# Patient Record
Sex: Male | Born: 1945 | Race: White | Hispanic: No | Marital: Married | Smoking: Never smoker
Health system: Southern US, Community
[De-identification: ages and names within clinical notes are randomized; demographics above are authoritative.]

## PROBLEM LIST (undated history)

## (undated) DIAGNOSIS — F329 Major depressive disorder, single episode, unspecified: Secondary | ICD-10-CM

## (undated) DIAGNOSIS — Z87442 Personal history of urinary calculi: Secondary | ICD-10-CM

## (undated) DIAGNOSIS — I10 Essential (primary) hypertension: Secondary | ICD-10-CM

## (undated) DIAGNOSIS — M1990 Unspecified osteoarthritis, unspecified site: Secondary | ICD-10-CM

## (undated) DIAGNOSIS — N200 Calculus of kidney: Secondary | ICD-10-CM

## (undated) DIAGNOSIS — K219 Gastro-esophageal reflux disease without esophagitis: Secondary | ICD-10-CM

## (undated) DIAGNOSIS — F32A Depression, unspecified: Secondary | ICD-10-CM

## (undated) DIAGNOSIS — J3089 Other allergic rhinitis: Secondary | ICD-10-CM

## (undated) HISTORY — PX: KNEE DEBRIDEMENT: SHX1894

## (undated) HISTORY — DX: Major depressive disorder, single episode, unspecified: F32.9

## (undated) HISTORY — PX: BACK SURGERY: SHX140

## (undated) HISTORY — DX: Gastro-esophageal reflux disease without esophagitis: K21.9

## (undated) HISTORY — DX: Depression, unspecified: F32.A

## (undated) HISTORY — PX: LUMBAR LAMINECTOMY: SHX95

## (undated) HISTORY — PX: SEPTOPLASTY: SUR1290

## (undated) HISTORY — PX: HAMMER TOE SURGERY: SHX385

## (undated) HISTORY — DX: Unspecified osteoarthritis, unspecified site: M19.90

## (undated) HISTORY — DX: Essential (primary) hypertension: I10

---

## 2003-12-12 ENCOUNTER — Ambulatory Visit: Payer: Self-pay | Admitting: Unknown Physician Specialty

## 2003-12-18 ENCOUNTER — Other Ambulatory Visit: Payer: Self-pay

## 2003-12-19 ENCOUNTER — Observation Stay: Payer: Self-pay | Admitting: Internal Medicine

## 2005-12-23 ENCOUNTER — Ambulatory Visit: Payer: Self-pay | Admitting: Internal Medicine

## 2006-03-22 ENCOUNTER — Ambulatory Visit: Payer: Self-pay | Admitting: Urology

## 2006-04-26 ENCOUNTER — Ambulatory Visit: Payer: Self-pay | Admitting: Urology

## 2006-05-21 ENCOUNTER — Ambulatory Visit: Payer: Self-pay | Admitting: Urology

## 2006-12-03 ENCOUNTER — Ambulatory Visit: Payer: Self-pay | Admitting: Urology

## 2006-12-14 ENCOUNTER — Other Ambulatory Visit: Payer: Self-pay

## 2006-12-14 ENCOUNTER — Ambulatory Visit: Payer: Self-pay | Admitting: Otolaryngology

## 2006-12-20 ENCOUNTER — Ambulatory Visit: Payer: Self-pay | Admitting: Otolaryngology

## 2007-10-07 ENCOUNTER — Ambulatory Visit: Payer: Self-pay | Admitting: Unknown Physician Specialty

## 2007-10-12 ENCOUNTER — Ambulatory Visit: Payer: Self-pay | Admitting: Unknown Physician Specialty

## 2007-12-05 ENCOUNTER — Ambulatory Visit: Payer: Self-pay | Admitting: Urology

## 2007-12-15 ENCOUNTER — Ambulatory Visit: Payer: Self-pay | Admitting: Unknown Physician Specialty

## 2008-12-12 ENCOUNTER — Ambulatory Visit: Payer: Self-pay | Admitting: Urology

## 2009-01-07 ENCOUNTER — Ambulatory Visit: Payer: Self-pay | Admitting: Internal Medicine

## 2009-01-12 ENCOUNTER — Emergency Department: Payer: Self-pay | Admitting: Emergency Medicine

## 2010-06-12 ENCOUNTER — Ambulatory Visit: Payer: Self-pay | Admitting: Unknown Physician Specialty

## 2010-06-30 ENCOUNTER — Ambulatory Visit: Payer: Self-pay | Admitting: Unknown Physician Specialty

## 2010-07-03 LAB — PATHOLOGY REPORT

## 2010-12-18 ENCOUNTER — Ambulatory Visit: Payer: Self-pay | Admitting: Podiatry

## 2011-01-13 HISTORY — PX: GREEN LIGHT LASER TURP (TRANSURETHRAL RESECTION OF PROSTATE: SHX6260

## 2013-05-18 ENCOUNTER — Ambulatory Visit: Payer: Self-pay | Admitting: Anesthesiology

## 2013-05-22 ENCOUNTER — Ambulatory Visit: Payer: Self-pay | Admitting: Urology

## 2014-05-05 NOTE — Op Note (Signed)
PATIENT NAME:  Raymond PoliceWILSON, Sherwin H MR#:  [id number] DATE OF BIRTH:  May 31, 1945  DATE OF PROCEDURE:  05/22/2013  PREOPERATIVE DIAGNOSIS: Benign prostatic hypertrophy with bladder outlet obstruction.   POSTOPERATIVE DIAGNOSIS: Benign prostatic hypertrophy with bladder outlet obstruction.   PROCEDURE: Photovaporization of the prostate with green light laser.   SURGEON: Anola GurneyMichael , M.D.   ANESTHETIST: Dr. Darleene CleaverVan Staveren and Dr. Evelene Croon.   ANESTHETIC METHOD: General per Dr. Darleene CleaverVan Staveren and local per Dr. Evelene Croon.   INDICATIONS: See the dictated history and physical. After informed consent, the patient requests the above procedure.   OPERATIVE SUMMARY: After adequate general anesthesia had been obtained, the patient was placed into dorsal lithotomy position and the perineum was prepped and draped in the usual fashion. The laser scope was coupled with the camera and then visually advanced into the bladder. The bladder was moderately trabeculated. The patient had trilobar BPH with visual obstruction. He had intravesical growth of a median lobe. Both ureteral orifices were identified and had clear efflux. No bladder mucosal lesions were identified. At this point, the laser was set at a power setting of 80 watts. Bladder neck tissue and median lobe was vaporized. Power was then increased to 120 watts and obstructing tissue from the bladder neck to the verumontanum was vaporized. Finally, the power was increased to 180 watts and remaining obstructive tissue vaporized. At this point, the laser scope was removed. Viscous Xylocaine 10 mL was instilled within the urethra and the bladder. A 20-French silicone Foley catheter was placed. The catheter was irrigated until clear. B and O suppository was placed. The procedure was then terminated and the patient was transferred to the recovery room in stable condition.  ____________________________ Suszanne ConnersMichael R. Evelene Croon, MD mrw:aw D: 05/22/2013 14:49:43 ET T: 05/22/2013  14:59:43 ET JOB#: [id number]  cc: Suszanne ConnersMichael R. Evelene Croon, MD, <Dictator> Orson ApeMICHAEL R  MD ELECTRONICALLY SIGNED 05/22/2013 16:49

## 2014-05-05 NOTE — H&P (Signed)
PATIENT NAME:  Raymond Edwards, Raymond Edwards MR#:  [id number] DATE OF BIRTH:  [birth year]  DATE OF ADMISSION:  05/22/2013  CHIEF COMPLAINT: Difficulty voiding.   HISTORY OF PRESENT ILLNESS: Mr. Raymond Edwards is a 69 year old white male with BPH and lower urinary tract symptoms. He is currently taking Jalyn. He has significant voiding symptoms including incomplete emptying, frequency, intermittency, urgency, weak stream, straining, and nocturia x 4. AUA symptom score was 30 with a quality of life score of 5. He is status post TUMT in 2012. He comes in now for photovaporization of the prostate with a GreenLight laser. Most recent PSA was 3.4 ng/mL on May 03, 2013.   PAST MEDICAL HISTORY:   ALLERGIES: No drug allergies.   CURRENT MEDICATIONS: Included Flonase, magnesium sulfate, nifedipine, omega-3 fish oil, omeprazole, sertraline, and Tylenol.   PAST SURGICAL HISTORY: Includes lumbar laminectomy and nasal septal repair.   SOCIAL HISTORY: The patient denied tobacco use. He consumes 1 to 4 alcoholic beverages per week.   FAMILY HISTORY: Parents have heart disease and hypertension.   PAST AND CURRENT MEDICAL CONDITIONS:  1.  Hypertension.  2.  GERD.  3.  Depression.  4.  Erectile dysfunction.  5.  Allergic rhinitis. 6.  History of kidney stones.   REVIEW OF SYSTEMS: The patient denied chest pain, shortness of breath, diabetes, stroke, or coronary artery disease.   PHYSICAL EXAMINATION: GENERAL: Well-nourished white male in no distress.  HEENT: Sclerae were clear. Pupils were equally round, reactive to light and accommodation. Extraocular movements were intact.  NECK: Supple. No palpable cervical adenopathy. No audible carotid bruits.  LUNGS: Clear to auscultation.  CARDIOVASCULAR: Regular rhythm and rate without audible murmurs.  ABDOMEN: Soft, nontender abdomen.  GENITOURINARY: Uncircumcised testes, smooth, nontender but atrophic.  RECTAL: 40 gram, smooth nontender prostate.  NEUROMUSCULAR: Alert and  oriented x 3.   IMPRESSION: Benign prostatic hypertrophy with bladder outlet obstruction.   PLAN: Photovaporization of the prostate with a GreenLight laser.   ____________________________ Suszanne ConnersMichael R. Evelene CroonWolff, MD mrw:jcm D: 05/16/2013 13:09:47 ET T: 05/16/2013 13:23:18 ET JOB#: [id number]  cc: Suszanne ConnersMichael R. Evelene CroonWolff, MD, <Dictator> Orson ApeMICHAEL R  MD ELECTRONICALLY SIGNED 05/17/2013 8:19

## 2016-05-05 ENCOUNTER — Ambulatory Visit (INDEPENDENT_AMBULATORY_CARE_PROVIDER_SITE_OTHER): Payer: Medicare Other | Admitting: Urology

## 2016-05-05 ENCOUNTER — Encounter: Payer: Self-pay | Admitting: Urology

## 2016-05-05 VITALS — BP 151/82 | HR 86 | Ht 70.0 in | Wt 229.7 lb

## 2016-05-05 DIAGNOSIS — R3129 Other microscopic hematuria: Secondary | ICD-10-CM | POA: Diagnosis not present

## 2016-05-05 LAB — URINALYSIS, COMPLETE
Bilirubin, UA: NEGATIVE
GLUCOSE, UA: NEGATIVE
KETONES UA: NEGATIVE
Nitrite, UA: NEGATIVE
PROTEIN UA: NEGATIVE
SPEC GRAV UA: 1.015 (ref 1.005–1.030)
UUROB: 0.2 mg/dL (ref 0.2–1.0)
pH, UA: 5 (ref 5.0–7.5)

## 2016-05-05 LAB — MICROSCOPIC EXAMINATION: BACTERIA UA: NONE SEEN

## 2016-05-05 NOTE — Progress Notes (Signed)
05/05/2016 3:20 PM   Raymond Edwards. [birth year] [id number]  Referring provider: Marisue Ivan, MD [phone] [facility] [address] [facility] [place], [place] [address]  Chief Complaint  Patient presents with  . Hematuria  . Testicle Pain    HPI: I was consulted to assess the patient's recent diagnosis of microscopic hematuria. He has never smoked. He does take daily aspirin but no blood thinners. His flow was good. He gets up once at night. He has passed many kidney stones but is not a previous GU surgery  He has had back surgery but no bladder infections  The patient had a green light laser in 2015 by Dr. Evelene Croon. The patient has chronic renal insufficiency with a GFR of 50 mils per minute.  Modifying factors: There are no other modifying factors  Associated signs and symptoms: There are no other associated signs and symptoms Aggravating and relieving factors: There are no other aggravating or relieving factors Severity: Moderate Duration: Persistent   PMH: Past Medical History:  Diagnosis Date  . Arthritis   . Depression   . GERD (gastroesophageal reflux disease)   . Hypertension     Surgical History: Past Surgical History:  Procedure Laterality Date  . GREEN LIGHT LASER TURP (TRANSURETHRAL RESECTION OF PROSTATE  2013  . HAMMER TOE SURGERY    . KNEE DEBRIDEMENT Right   . LUMBAR LAMINECTOMY    . SEPTOPLASTY      Home Medications:  Allergies as of 05/05/2016   No Known Allergies     Medication List       Accurate as of 05/05/16  3:20 PM. Always use your most recent med list.          aspirin EC 81 MG tablet Take by mouth.   Cinnamon 500 MG capsule Take by mouth.   doxepin 25 MG capsule Commonly known as:  SINEQUAN   fluticasone 50 MCG/ACT nasal spray Commonly known as:  FLONASE PLACE 2 SPRAYS INTO BOTH NOSTRILS ONCE DAILY AS NEEDED.   losartan 50 MG tablet Commonly known as:  COZAAR Take by mouth.   Omega-3 1000 MG Caps Take by  mouth.   omeprazole 20 MG capsule Commonly known as:  PRILOSEC TAKE ONE CAPSULE BY MOUTH EVERY MORNING   sertraline 50 MG tablet Commonly known as:  ZOLOFT TAKE 1 TABLET BY MOUTH EVERY DAY       Allergies: No Known Allergies  Family History: Family History  Problem Relation Age of Onset  . Heart disease Father   . Heart disease Mother   . Prostate cancer Neg Hx   . Bladder Cancer Neg Hx   . Kidney cancer Neg Hx     Social History:  reports that he has never smoked. He has quit using smokeless tobacco. His smokeless tobacco use included Chew. He reports that he drinks alcohol. He reports that he does not use drugs.  ROS: UROLOGY Frequent Urination?: No Hard to postpone urination?: No Burning/pain with urination?: No Get up at night to urinate?: Yes Leakage of urine?: No Urine stream starts and stops?: No Trouble starting stream?: No Do you have to strain to urinate?: No Blood in urine?: Yes Urinary tract infection?: No Sexually transmitted disease?: No Injury to kidneys or bladder?: No Painful intercourse?: No Weak stream?: No Erection problems?: Yes Penile pain?: No  Gastrointestinal Nausea?: No Vomiting?: No Indigestion/heartburn?: No Diarrhea?: No Constipation?: No  Constitutional Fever: No Night sweats?: No Weight loss?: No Fatigue?: No  Skin Skin rash/lesions?: Yes  Itching?: Yes  Eyes Blurred vision?: No Double vision?: No  Ears/Nose/Throat Sore throat?: No Sinus problems?: No  Hematologic/Lymphatic Swollen glands?: No Easy bruising?: No  Cardiovascular Leg swelling?: No Chest pain?: No  Respiratory Cough?: No Shortness of breath?: No  Endocrine Excessive thirst?: No  Musculoskeletal Back pain?: Yes Joint pain?: No  Neurological Headaches?: No Dizziness?: No  Psychologic Depression?: Yes Anxiety?: No  Physical Exam: BP (!) 151/82 (BP Location: Left Arm, Patient Position: Sitting, Cuff Size: Normal)   Pulse 86    Ht  (1.778 m)   Wt 229 lb 11.2 oz (104.2 kg)   BMI 32.96 kg/m   Constitutional:  Alert and oriented, No acute distress. HEENT: [place] AT, moist mucus membranes.  Trachea midline, no masses. Cardiovascular: No clubbing, cyanosis, or edema. Respiratory: Normal respiratory effort, no increased work of breathing. GI: Abdomen is soft, nontender, nondistended, no abdominal masses GU: Small 40 g benign prostate Skin: No rashes, bruises or suspicious lesions. Lymph: No cervical or inguinal adenopathy. Neurologic: Grossly intact, no focal deficits, moving all 4 extremities. Psychiatric: Normal mood and affect.  Laboratory Data:  Urinalysis No results found for: COLORURINE, APPEARANCEUR, LABSPEC, PHURINE, GLUCOSEU, HGBUR, BILIRUBINUR, KETONESUR, PROTEINUR, UROBILINOGEN, NITRITE, LEUKOCYTESUR  Pertinent Imaging: None  Assessment & Plan:  The patient has microscopic hematuria. He has mild voiding dysfunction but overall does very well with mild nocturia. The workup was described. When he comes back he likely will be cystoscoped by another provider based upon scheduling. I will organize a CT scan.  1. Microscopic hematuria 2. Nighttime frequency - Urinalysis, Complete   No Follow-up on file.  Martina Sinner, MD  [facility] [address], [address] [place], [place] [address] [phone]

## 2016-05-05 NOTE — Addendum Note (Signed)
Addended by: Honor Loh on: 05/05/2016 04:51 PM   Modules accepted: Orders

## 2016-05-14 ENCOUNTER — Ambulatory Visit: Payer: Self-pay | Admitting: Urology

## 2016-05-19 ENCOUNTER — Ambulatory Visit
Admission: RE | Admit: 2016-05-19 | Discharge: 2016-05-19 | Disposition: A | Discharge status: Home / Self Care | Payer: Medicare Other | Source: Ambulatory Visit | Attending: Urology | Admitting: Urology

## 2016-05-19 DIAGNOSIS — K449 Diaphragmatic hernia without obstruction or gangrene: Secondary | ICD-10-CM | POA: Insufficient documentation

## 2016-05-19 DIAGNOSIS — R3129 Other microscopic hematuria: Secondary | ICD-10-CM

## 2016-05-19 DIAGNOSIS — N132 Hydronephrosis with renal and ureteral calculous obstruction: Secondary | ICD-10-CM | POA: Diagnosis not present

## 2016-05-19 DIAGNOSIS — K5730 Diverticulosis of large intestine without perforation or abscess without bleeding: Secondary | ICD-10-CM | POA: Insufficient documentation

## 2016-05-19 MED ORDER — IOPAMIDOL (ISOVUE-300) INJECTION 61%
150.0000 mL | Freq: Once | INTRAVENOUS | Status: AC | PRN
  Administered 2016-05-19: 125 mL via INTRAVENOUS

## 2016-05-20 ENCOUNTER — Encounter
Admission: RE | Admit: 2016-05-20 | Discharge: 2016-05-20 | Disposition: A | Discharge status: Home / Self Care | Payer: Medicare Other | Source: Ambulatory Visit | Attending: Urology | Admitting: Urology

## 2016-05-20 ENCOUNTER — Other Ambulatory Visit: Payer: Self-pay | Admitting: Radiology

## 2016-05-20 ENCOUNTER — Telehealth: Payer: Self-pay | Admitting: Radiology

## 2016-05-20 DIAGNOSIS — K219 Gastro-esophageal reflux disease without esophagitis: Secondary | ICD-10-CM | POA: Diagnosis not present

## 2016-05-20 DIAGNOSIS — Z87442 Personal history of urinary calculi: Secondary | ICD-10-CM | POA: Diagnosis not present

## 2016-05-20 DIAGNOSIS — Z7951 Long term (current) use of inhaled steroids: Secondary | ICD-10-CM | POA: Diagnosis not present

## 2016-05-20 DIAGNOSIS — N201 Calculus of ureter: Secondary | ICD-10-CM

## 2016-05-20 DIAGNOSIS — Z720 Tobacco use: Secondary | ICD-10-CM | POA: Diagnosis not present

## 2016-05-20 DIAGNOSIS — Z8249 Family history of ischemic heart disease and other diseases of the circulatory system: Secondary | ICD-10-CM | POA: Diagnosis not present

## 2016-05-20 DIAGNOSIS — Z79899 Other long term (current) drug therapy: Secondary | ICD-10-CM | POA: Diagnosis not present

## 2016-05-20 DIAGNOSIS — Z7982 Long term (current) use of aspirin: Secondary | ICD-10-CM | POA: Diagnosis not present

## 2016-05-20 DIAGNOSIS — Z9889 Other specified postprocedural states: Secondary | ICD-10-CM | POA: Diagnosis not present

## 2016-05-20 DIAGNOSIS — Z87891 Personal history of nicotine dependence: Secondary | ICD-10-CM | POA: Diagnosis not present

## 2016-05-20 DIAGNOSIS — F329 Major depressive disorder, single episode, unspecified: Secondary | ICD-10-CM | POA: Diagnosis not present

## 2016-05-20 DIAGNOSIS — R3129 Other microscopic hematuria: Secondary | ICD-10-CM | POA: Diagnosis not present

## 2016-05-20 DIAGNOSIS — I10 Essential (primary) hypertension: Secondary | ICD-10-CM

## 2016-05-20 HISTORY — DX: Calculus of kidney: N20.0

## 2016-05-20 HISTORY — DX: Other allergic rhinitis: J30.89

## 2016-05-20 LAB — BASIC METABOLIC PANEL
Anion gap: 6 (ref 5–15)
BUN: 15 mg/dL (ref 6–20)
CALCIUM: 8.9 mg/dL (ref 8.9–10.3)
CO2: 27 mmol/L (ref 22–32)
CREATININE: 1.51 mg/dL — AB (ref 0.61–1.24)
Chloride: 104 mmol/L (ref 101–111)
GFR calc non Af Amer: 45 mL/min — ABNORMAL LOW (ref >60)
GFR, EST AFRICAN AMERICAN: 52 mL/min — AB (ref >60)
Glucose, Bld: 111 mg/dL — ABNORMAL HIGH (ref 65–99)
Potassium: 3.8 mmol/L (ref 3.5–5.1)
SODIUM: 137 mmol/L (ref 135–145)

## 2016-05-20 NOTE — Patient Instructions (Signed)
Your procedure is scheduled on: 05/21/16 At 11:30 AM Report to Same Day Surgery 2nd floor medical mall [facility](Medical Mall Entrance-take elevator on left to 2nd floor.  Check in with surgery information desk.)   Remember: Instructions that are not followed completely may result in serious medical risk, up to and including death, or upon the discretion of your surgeon and anesthesiologist your surgery may need to be rescheduled.    _x___ 1. Do not eat food or drink liquids after midnight. No gum chewing or                              hard candies.     __x__ 2. No Alcohol for 24 hours before or after surgery.   __x__3. No Smoking for 24 prior to surgery.   ____  4. Bring all medications with you on the day of surgery if instructed.    __x__ 5. Notify your doctor if there is any change in your medical condition     (cold, fever, infections).     Do not wear jewelry, make-up, hairpins, clips or nail polish.  Do not wear lotions, powders, or perfumes. You may wear deodorant.  Do not shave 48 hours prior to surgery. Men may shave face and neck.  Do not bring valuables to the hospital.    [facility] is not responsible for any belongings or valuables.               Contacts, dentures or bridgework may not be worn into surgery.  Leave your suitcase in the car. After surgery it may be brought to your room.  For patients admitted to the hospital, discharge time is determined by your                       treatment team.   Patients discharged the day of surgery will not be allowed to drive home.  You will need someone to drive you home and stay with you the night of your procedure.    Please read over the following fact sheets that you were given:   [facility] Preparing for Surgery and or MRSA Information   _x___ Take anti-hypertensive (unless it includes a diuretic), cardiac, seizure, asthma,     anti-reflux and psychiatric medicines. These include:  1. sertraline (ZOLOFT  2.omeprazole  (PRILOSEC  3.  4.  5.  6.  ____Fleets enema or Magnesium Citrate as directed.   _x___ Use CHG Soap or sage wipes as directed on instruction sheet   ____ Use inhalers on the day of surgery and bring to hospital day of surgery  ____ Stop Metformin and Janumet 2 days prior to surgery.    ____ Take 1/2 of usual insulin dose the night before surgery and none on the morning     surgery.   _x___ Follow recommendations from Cardiologist, Pulmonologist or PCP regarding          stopping Aspirin, Coumadin, Pllavix ,Eliquis, Effient, or Pradaxa, and Pletal.  X____Stop Anti-inflammatories such as Advil, Aleve, Ibuprofen, Motrin, Naproxen, Naprosyn, Goodies powders or aspirin products. OK to take Tylenol and                          Celebrex.   _x___ Stop supplements until after surgery.  But may continue Vitamin D, Vitamin B,       and multivitamin.   ____ Layla MawBring  C-Pap to the hospital.

## 2016-05-20 NOTE — Telephone Encounter (Signed)
Notified pt of surgery with Dr Sherryl BartersBudzyn 05/21/16, pre-admit testing appt 05/20/16 @12 :15 & [facility] day of [facility]. Pt voices understanding.

## 2016-05-21 ENCOUNTER — Ambulatory Visit: Payer: Medicare Other | Admitting: Anesthesiology

## 2016-05-21 ENCOUNTER — Other Ambulatory Visit: Payer: Self-pay | Admitting: Urology

## 2016-05-21 ENCOUNTER — Ambulatory Visit
Admission: RE | Admit: 2016-05-21 | Discharge: 2016-05-21 | Disposition: A | Discharge status: Home / Self Care | Payer: Medicare Other | Source: Ambulatory Visit | Attending: Urology | Admitting: Urology

## 2016-05-21 ENCOUNTER — Other Ambulatory Visit: Payer: Self-pay | Admitting: Radiology

## 2016-05-21 ENCOUNTER — Telehealth: Payer: Self-pay | Admitting: Urology

## 2016-05-21 ENCOUNTER — Encounter
Admission: RE | Disposition: A | Discharge status: Home / Self Care | Payer: Self-pay | Source: Ambulatory Visit | Attending: Urology

## 2016-05-21 ENCOUNTER — Encounter: Payer: Self-pay | Admitting: *Deleted

## 2016-05-21 DIAGNOSIS — Z87891 Personal history of nicotine dependence: Secondary | ICD-10-CM | POA: Insufficient documentation

## 2016-05-21 DIAGNOSIS — Z720 Tobacco use: Secondary | ICD-10-CM | POA: Insufficient documentation

## 2016-05-21 DIAGNOSIS — Z7951 Long term (current) use of inhaled steroids: Secondary | ICD-10-CM | POA: Insufficient documentation

## 2016-05-21 DIAGNOSIS — Z87442 Personal history of urinary calculi: Secondary | ICD-10-CM | POA: Insufficient documentation

## 2016-05-21 DIAGNOSIS — Z9889 Other specified postprocedural states: Secondary | ICD-10-CM | POA: Insufficient documentation

## 2016-05-21 DIAGNOSIS — N201 Calculus of ureter: Secondary | ICD-10-CM | POA: Insufficient documentation

## 2016-05-21 DIAGNOSIS — Z7982 Long term (current) use of aspirin: Secondary | ICD-10-CM | POA: Insufficient documentation

## 2016-05-21 DIAGNOSIS — F329 Major depressive disorder, single episode, unspecified: Secondary | ICD-10-CM | POA: Insufficient documentation

## 2016-05-21 DIAGNOSIS — R3121 Asymptomatic microscopic hematuria: Secondary | ICD-10-CM | POA: Diagnosis not present

## 2016-05-21 DIAGNOSIS — R3129 Other microscopic hematuria: Secondary | ICD-10-CM | POA: Insufficient documentation

## 2016-05-21 DIAGNOSIS — K219 Gastro-esophageal reflux disease without esophagitis: Secondary | ICD-10-CM | POA: Insufficient documentation

## 2016-05-21 DIAGNOSIS — Z8249 Family history of ischemic heart disease and other diseases of the circulatory system: Secondary | ICD-10-CM | POA: Insufficient documentation

## 2016-05-21 DIAGNOSIS — Z79899 Other long term (current) drug therapy: Secondary | ICD-10-CM | POA: Insufficient documentation

## 2016-05-21 DIAGNOSIS — I10 Essential (primary) hypertension: Secondary | ICD-10-CM | POA: Insufficient documentation

## 2016-05-21 HISTORY — PX: CYSTOSCOPY WITH STENT PLACEMENT: SHX5790

## 2016-05-21 SURGERY — CYSTOSCOPY, WITH STENT INSERTION
Anesthesia: General | Laterality: Bilateral

## 2016-05-21 MED ORDER — PHENYLEPHRINE HCL 10 MG/ML IJ SOLN
INTRAMUSCULAR | Status: DC | PRN
  Administered 2016-05-21 (×2): 100 ug via INTRAVENOUS
  Administered 2016-05-21: 50 ug via INTRAVENOUS
  Administered 2016-05-21: 100 ug via INTRAVENOUS
  Administered 2016-05-21: 50 ug via INTRAVENOUS
  Administered 2016-05-21 (×2): 100 ug via INTRAVENOUS

## 2016-05-21 MED ORDER — FENTANYL CITRATE (PF) 100 MCG/2ML IJ SOLN: 25.0000 ug | INTRAMUSCULAR | Status: DC | PRN

## 2016-05-21 MED ORDER — PROPOFOL 10 MG/ML IV BOLUS
INTRAVENOUS | Status: DC | PRN
  Administered 2016-05-21: 200 mg via INTRAVENOUS
  Administered 2016-05-21: 100 mg via INTRAVENOUS

## 2016-05-21 MED ORDER — CEPHALEXIN 500 MG PO CAPS: 500.0000 mg | ORAL_CAPSULE | Freq: Three times a day (TID) | ORAL | 0 refills | Status: DC

## 2016-05-21 MED ORDER — OXYCODONE-ACETAMINOPHEN 5-325 MG PO TABS: 1.0000 | ORAL_TABLET | ORAL | 0 refills | Status: DC | PRN

## 2016-05-21 MED ORDER — ACETAMINOPHEN 10 MG/ML IV SOLN
INTRAVENOUS | Status: DC | PRN
  Administered 2016-05-21: 1000 mg via INTRAVENOUS

## 2016-05-21 MED ORDER — MIDAZOLAM HCL 2 MG/2ML IJ SOLN
INTRAMUSCULAR | Status: AC
  Filled 2016-05-21: qty 2

## 2016-05-21 MED ORDER — SODIUM CHLORIDE 0.9 % IR SOLN
Status: DC | PRN
  Administered 2016-05-21: 900 mL via INTRAVESICAL

## 2016-05-21 MED ORDER — FENTANYL CITRATE (PF) 100 MCG/2ML IJ SOLN
INTRAMUSCULAR | Status: DC | PRN
Start: 2016-05-21 — End: 2016-05-21
  Administered 2016-05-21: 50 ug via INTRAVENOUS
  Administered 2016-05-21 (×2): 25 ug via INTRAVENOUS

## 2016-05-21 MED ORDER — ONDANSETRON HCL 4 MG/2ML IJ SOLN
INTRAMUSCULAR | Status: AC
  Filled 2016-05-21: qty 2

## 2016-05-21 MED ORDER — LACTATED RINGERS IV SOLN
INTRAVENOUS | Status: DC
  Administered 2016-05-21: 12:00:00 via INTRAVENOUS

## 2016-05-21 MED ORDER — PROPOFOL 10 MG/ML IV BOLUS
INTRAVENOUS | Status: AC
  Filled 2016-05-21: qty 20

## 2016-05-21 MED ORDER — DEXAMETHASONE SODIUM PHOSPHATE 10 MG/ML IJ SOLN
INTRAMUSCULAR | Status: DC | PRN
  Administered 2016-05-21: 10 mg via INTRAVENOUS

## 2016-05-21 MED ORDER — EPHEDRINE SULFATE 50 MG/ML IJ SOLN
INTRAMUSCULAR | Status: DC | PRN
  Administered 2016-05-21: 10 mg via INTRAVENOUS
  Administered 2016-05-21: 5 mg via INTRAVENOUS

## 2016-05-21 MED ORDER — ACETAMINOPHEN 10 MG/ML IV SOLN
INTRAVENOUS | Status: AC
  Filled 2016-05-21: qty 100

## 2016-05-21 MED ORDER — ONDANSETRON HCL 4 MG/2ML IJ SOLN
INTRAMUSCULAR | Status: DC | PRN
  Administered 2016-05-21: 4 mg via INTRAVENOUS

## 2016-05-21 MED ORDER — MIDAZOLAM HCL 2 MG/2ML IJ SOLN
INTRAMUSCULAR | Status: DC | PRN
  Administered 2016-05-21: 2 mg via INTRAVENOUS

## 2016-05-21 MED ORDER — DEXAMETHASONE SODIUM PHOSPHATE 10 MG/ML IJ SOLN
INTRAMUSCULAR | Status: AC
  Filled 2016-05-21: qty 1

## 2016-05-21 MED ORDER — FENTANYL CITRATE (PF) 100 MCG/2ML IJ SOLN
INTRAMUSCULAR | Status: AC
  Filled 2016-05-21: qty 2

## 2016-05-21 MED ORDER — ONDANSETRON HCL 4 MG/2ML IJ SOLN: 4.0000 mg | Freq: Once | INTRAMUSCULAR | Status: DC | PRN

## 2016-05-21 MED ORDER — CEFAZOLIN SODIUM-DEXTROSE 2-4 GM/100ML-% IV SOLN
2.0000 g | INTRAVENOUS | Status: AC
  Administered 2016-05-21: 2 g via INTRAVENOUS

## 2016-05-21 MED ORDER — CEFAZOLIN SODIUM-DEXTROSE 2-4 GM/100ML-% IV SOLN
INTRAVENOUS | Status: AC
  Administered 2016-05-21: 2 g via INTRAVENOUS
  Filled 2016-05-21: qty 100

## 2016-05-21 MED ORDER — SEVOFLURANE IN SOLN
RESPIRATORY_TRACT | Status: AC
Start: 2016-05-21 — End: 2016-05-21
  Filled 2016-05-21: qty 250

## 2016-05-21 MED ORDER — LIDOCAINE HCL (CARDIAC) 20 MG/ML IV SOLN
INTRAVENOUS | Status: DC | PRN
  Administered 2016-05-21: 80 mg via INTRAVENOUS

## 2016-05-21 SURGICAL SUPPLY — 22 items
BACTOSHIELD CHG 4% 4OZ (MISCELLANEOUS) ×2
CATH URETL 5X70 OPEN END (CATHETERS) ×3 IMPLANT
CONRAY 43 FOR UROLOGY 50M (MISCELLANEOUS) ×3 IMPLANT
GLIDEWIRE STIFF .35X180X3 HYDR (WIRE) ×3 IMPLANT
GLOVE BIO SURGEON STRL SZ7.5 (GLOVE) ×3 IMPLANT
GOWN STRL REUS W/ TWL LRG LVL4 (GOWN DISPOSABLE) ×3 IMPLANT
GOWN STRL REUS W/TWL LRG LVL4 (GOWN DISPOSABLE) ×6
GOWN STRL REUS W/TWL XL LVL3 (GOWN DISPOSABLE) IMPLANT
INTRODUCER DILATOR DOUBLE (INTRODUCER) ×3 IMPLANT
KIT RM TURNOVER CYSTO AR (KITS) ×3 IMPLANT
PACK CYSTO AR (MISCELLANEOUS) ×3 IMPLANT
SCRUB CHG 4% DYNA-HEX 4OZ (MISCELLANEOUS) ×1 IMPLANT
SENSORWIRE 0.038 NOT ANGLED (WIRE)
SET CYSTO W/LG BORE CLAMP LF (SET/KITS/TRAYS/PACK) ×3 IMPLANT
SOL .9 NS 3000ML IRR  AL (IV SOLUTION) ×2
SOL .9 NS 3000ML IRR UROMATIC (IV SOLUTION) ×1 IMPLANT
STENT URET 6FRX24 CONTOUR (STENTS) IMPLANT
STENT URET 6FRX26 CONTOUR (STENTS) ×3 IMPLANT
SURGILUBE 2OZ TUBE FLIPTOP (MISCELLANEOUS) ×3 IMPLANT
SYR 30ML LL (SYRINGE) ×3 IMPLANT
WATER STERILE IRR 1000ML POUR (IV SOLUTION) ×3 IMPLANT
WIRE SENSOR 0.038 NOT ANGLED (WIRE) IMPLANT

## 2016-05-21 NOTE — Anesthesia Procedure Notes (Signed)
Procedure Name: LMA Insertion Date/Time: 05/21/2016 12:43 PM Performed by: Karoline CaldwellSTARR,  Pre-anesthesia Checklist: Patient identified, Patient being monitored, Timeout performed, Emergency Drugs available and Suction available Patient Re-evaluated:Patient Re-evaluated prior to inductionOxygen Delivery Method: Circle system utilized Preoxygenation: Pre-oxygenation with 100% oxygen Intubation Type: IV induction Ventilation: Mask ventilation without difficulty LMA: LMA inserted LMA Size: 4.5 Tube type: Oral Number of attempts: 1 Placement Confirmation: positive ETCO2 and breath sounds checked- equal and bilateral Tube secured with: Tape Dental Injury: Teeth and Oropharynx as per pre-operative assessment

## 2016-05-21 NOTE — Discharge Instructions (Signed)
AMBULATORY SURGERY  DISCHARGE INSTRUCTIONS   1) The drugs that you were given will stay in your system until tomorrow so for the next 24 hours you should not:  A) Drive an automobile B) Make any legal decisions C) Drink any alcoholic beverage   2) You may resume regular meals tomorrow.  Today it is better to start with liquids and gradually work up to solid foods.  You may eat anything you prefer, but it is better to start with liquids, then soup and crackers, and gradually work up to solid foods.   3) Please notify your doctor immediately if you have any unusual bleeding, trouble breathing, redness and pain at the surgery site, drainage, fever, or pain not relieved by medication.    4) Additional Instructions: TAKE A STOOL SOFTENER TWICE A DAY WHILE TAKING NARCOTIC PAIN MEDICINE TO PREVENT CONSTIPATION   Please contact your physician with any problems or Same Day Surgery at [phone], Monday through Friday 6 am to 4 pm, or [place] at [facility] Main number at [phone].   Cystoscopy, Care After Refer to this sheet in the next few weeks. These instructions provide you with information about caring for yourself after your procedure. Your health care provider may also give you more specific instructions. Your treatment has been planned according to current medical practices, but problems sometimes occur. Call your health care provider if you have any problems or questions after your procedure. What can I expect after the procedure? After the procedure, it is common to have:  Mild pain when you urinate. Pain should stop within a few minutes after you urinate. This may last for up to 1 week.  A small amount of blood in your urine for several days.  Feeling like you need to urinate but producing only a small amount of urine. Follow these instructions at home:   Medicines   Take over-the-counter and prescription medicines only as told by your health care provider.  If you  were prescribed an antibiotic medicine, take it as told by your health care provider. Do not stop taking the antibiotic even if you start to feel better. General instructions    Return to your normal activities as told by your health care provider. Ask your health care provider what activities are safe for you.  Do not drive for 24 hours if you received a sedative.  Watch for any blood in your urine. If the amount of blood in your urine increases, call your health care provider.  Follow instructions from your health care provider about eating or drinking restrictions.  If a tissue sample was removed for testing (biopsy) during your procedure, it is your responsibility to get your test results. Ask your health care provider or the department performing the test when your results will be ready.  Drink enough fluid to keep your urine clear or pale yellow.  Keep all follow-up visits as told by your health care provider. This is important. Contact a health care provider if:  You have pain that gets worse or does not get better with medicine, especially pain when you urinate.  You have difficulty urinating. Get help right away if:  You have more blood in your urine.  You have blood clots in your urine.  You have abdominal pain.  You have a fever or chills.  You are unable to urinate. This information is not intended to replace advice given to you by your health care provider. Make sure you discuss any questions you  have with your health care provider. Document Released: 07/18/2004 Document Revised: 06/06/2015 Document Reviewed: 11/15/2014 Elsevier Interactive Patient Education  2017 [employer].

## 2016-05-21 NOTE — Anesthesia Post-op Follow-up Note (Cosign Needed)
Anesthesia QCDR form completed.        

## 2016-05-21 NOTE — Telephone Encounter (Signed)
CT reviewed yesterday, large bilateral obstructing ureteral stones. Options reviewed in detail, given concern for bilateral obstruction, I recommended urgent ureteral stent placement. At the time of our phone call yesterday, he started eating breakfast therefore we'll proceed tomorrow. All questions were answered. He'll ultimately need bilateral ureteroscopy versus ESWL for definitive management of his stone. Currently, he is completely asymptomatic. BMP checked, Cr ~1.5 which is stable but increased from last year.    Vanna ScotlandAshley , MD

## 2016-05-21 NOTE — Op Note (Signed)
Date of procedure: 05/21/16  Preoperative diagnosis:  1. Bilateral ureteral stones  2. Microscopic hematuria  Postoperative diagnosis:  1. Same   Procedure: 1. Cystoscopy 2. Bilateral retrograde pyelogram with interpretation 3. Attempted right ureteral stent placement 4. Left ureteral stent placement 4.8 French by 26 cm  Surgeon: Baruch Gouty, MD  Anesthesia: General  Complications: None  Intraoperative findings: The patient's right proximal 1/2 cm UPJ stone was impacted. I was unable to pass a wire successfully pass the stone. A small amount of contrast was able to be pushed past the stone however most of the contrast and back into the bladder. The left ureteral stone was also impacted in the distal ureter. Small 0.8 [place] by 26 cm double-J ureteral stent was able to place. Left retropyelogram did show some contrast in the upper elected system to identify the renal pelvis for correct stent placed.  EBL: None  Specimens: None  Drains: 4.8 [place] by 26 under left double-J ureteral stent  Disposition: Stable to the postanesthesia care unit  Indication for procedure: The patient is a 71 y.o. male with history of microscopic hematuria who had a workup revealed bilateral ureteral stones. He had a 1.5 cm right UPJ stone and a left 1.5 cm mid to distal ureteral stone. There is also some smaller stones. He presents today for urgent stent placement due to bilateral ureteral obstruction..  After reviewing the management options for treatment, the patient elected to proceed with the above surgical procedure(s). We have discussed the potential benefits and risks of the procedure, side effects of the proposed treatment, the likelihood of the patient achieving the goals of the procedure, and any potential problems that might occur during the procedure or recuperation. Informed consent has been obtained.  Description of procedure: The patient was met in the preoperative area. All risks,  benefits, and indications of the procedure were described in great detail. The patient consented to the procedure. Preoperative antibiotics were given. The patient was taken to the operative theater. General anesthesia was induced per the anesthesia service. The patient was then placed in the dorsal lithotomy position and prepped and draped in the usual sterile fashion. A preoperative timeout was called.   A 21 French 30 cystoscope was advanced in the patient's bladder per urethra atraumatically. The right ureteral orifice was identified and retrograde polygrams obtained. Contrast was very difficult pushed past the visualized right UPJ stone. Only a small amount of contrast was able to be advanced past the stone into the right renal pelvis to identify. For approximately 20 minutes attempts were made to place multiple different types of wires past the stone. However the stone was impacted. At this point was aborted because his felt to be futile. This process was repeated on the left side. The stone was seen in the mid to distal left ureter. With some degree of difficulty sensor wire was eventually able to be passed into the proximal left renal pelvis under fluoroscopy. This was done after a retrograde polygrams obtained. A 4.8 French by 26 over double-J ureteral stent was then placed and the sensor wire removed. A curl seen in the patient's left renal pelvis on fluoroscopy and the urinary bladder under direct position. There is excellent drainage clear yellow urine through the stent this point. Due the patient's right ureteral stone being impacted and unable to place a retrograde stent and the patient now having an obstructed left collecting system, decision was made to end the procedure. The patient's bladder was drained. He was  woke from anesthesia and transferred stable condition to postanesthesia care unit.   Plan: The patient will need nonemergent antegrade right ureteral stent placement by interventional  radiology. After this is performed, we can plan for bilateral ureteroscopy with laser lithotripsy to address his bilateral stone burden approximately 2 weeks after placement allow for passive dilation of the bilateral ureters.  Baruch Gouty, M.D.

## 2016-05-21 NOTE — Telephone Encounter (Signed)
-----   Message from Alfredo MartinezScott MacDiarmid, MD sent at 05/20/2016  7:44 AM EDT -----   ----- Message ----- From: Skeet LatchWatkins, Chelsea C, LPN Sent: 1/1/91475/08/2016  12:34 PM To: Alfredo MartinezScott MacDiarmid, MD    ----- Message ----- From: Interface, Rad Results In Sent: 05/19/2016  12:21 PM To: Jennette KettleBua Clinical

## 2016-05-21 NOTE — Anesthesia Preprocedure Evaluation (Signed)
Anesthesia Evaluation  Patient identified by MRN, date of birth, ID band Patient awake    Reviewed: Allergy & Precautions, H&P , NPO status , Patient's Chart, lab work & pertinent test results, reviewed documented beta blocker date and time   History of Anesthesia Complications Negative for: history of anesthetic complications  Airway Mallampati: III  TM Distance: >3 FB Neck ROM: full    Dental  (+) Caps, Missing, Chipped, Poor Dentition   Pulmonary neg pulmonary ROS,           Cardiovascular Exercise Tolerance: Good hypertension, (-) angina(-) CAD, (-) Past MI, (-) Cardiac Stents and (-) CABG (-) dysrhythmias (-) Valvular Problems/Murmurs     Neuro/Psych PSYCHIATRIC DISORDERS (Depression) negative neurological ROS     GI/Hepatic Neg liver ROS, GERD  ,  Endo/Other  negative endocrine ROS  Renal/GU Renal disease (stones)  negative genitourinary   Musculoskeletal   Abdominal   Peds  Hematology negative hematology ROS (+)   Anesthesia Other Findings Past Medical History: No date: Arthritis No date: Depression No date: Environmental and seasonal allergies No date: GERD (gastroesophageal reflux disease) No date: Hypertension No date: Kidney stones   Reproductive/Obstetrics negative OB ROS                             Anesthesia Physical Anesthesia Plan  ASA: III  Anesthesia Plan: General   Post-op Pain Management:    Induction:   Airway Management Planned:   Additional Equipment:   Intra-op Plan:   Post-operative Plan:   Informed Consent: I have reviewed the patients History and Physical, chart, labs and discussed the procedure including the risks, benefits and alternatives for the proposed anesthesia with the patient or authorized representative who has indicated his/her understanding and acceptance.   Dental Advisory Given  Plan Discussed with: Anesthesiologist, CRNA and  Surgeon  Anesthesia Plan Comments:         Anesthesia Quick Evaluation

## 2016-05-21 NOTE — Transfer of Care (Signed)
Immediate Anesthesia Transfer of Care Note  Patient: Eda KeysColeman H Caples Jr.  Procedure(s) Performed: Procedure(s): CYSTOSCOPY WITH bilateral retrorade pylogram, left ureteral stent placement, attempted inertion of right ureteral stent (Bilateral)  Patient Location: PACU  Anesthesia Type:General  Level of Consciousness: awake, alert  and oriented  Airway & Oxygen Therapy: Patient Spontanous Breathing and Patient connected to face mask oxygen  Post-op Assessment: Report given to RN and Post -op Vital signs reviewed and stable  Post vital signs: Reviewed and stable  Last Vitals:  Vitals:   05/21/16 1149 05/21/16 1346  BP: (!) 181/100 138/76  Pulse: 78 81  Resp: 18 10  Temp: 36.7 C 36.3 C    Last Pain:  Vitals:   05/21/16 1346  TempSrc:   PainSc: 0-No pain         Complications: No apparent anesthesia complications

## 2016-05-21 NOTE — H&P (Signed)
11:51 AM   Raymond Edwards H Barabas Jr. 28-Feb-1945 [id number]  CC: Bilateral ureteral stones  HPI: The patient is a 71 year old gentleman who presents today for urgent bilateral ureteral stent placement via cystoscopy after being diagnosed with bilateral ureteral stones on CT urogram performed for microscopic hematuria. He has a large 1.6 cm UPJ stone on the right.  His creatinine is 1.5 which is surprisingly good given how large the bilateral obstructing stones are. Though it is slightly raised from baseline. He currently is voiding. At this time, we'll proceed with bilateral ureteral stent placement and cystoscopy.   PMH: Past Medical History:  Diagnosis Date  . Arthritis   . Depression   . Environmental and seasonal allergies   . GERD (gastroesophageal reflux disease)   . Hypertension   . Kidney stones     Surgical History: Past Surgical History:  Procedure Laterality Date  . GREEN LIGHT LASER TURP (TRANSURETHRAL RESECTION OF PROSTATE  2013  . HAMMER TOE SURGERY    . KNEE DEBRIDEMENT Right   . LUMBAR LAMINECTOMY    . SEPTOPLASTY        Allergies: No Known Allergies  Family History: Family History  Problem Relation Age of Onset  . Heart disease Father   . Heart disease Mother   . Prostate cancer Neg Hx   . Bladder Cancer Neg Hx   . Kidney cancer Neg Hx     Social History:  reports that he has never smoked. He has quit using smokeless tobacco. His smokeless tobacco use included Chew. He reports that he drinks about 4.2 oz of alcohol per week . He reports that he does not use drugs.  ROS: 12 point ROS negative except for above                                        Physical Exam: BP (!) 181/100   Pulse 78   Temp 98 F (36.7 C) (Oral)   Resp 18   Ht 5\' 10"  (1.778 m)   Wt 230 lb (104.3 kg)   SpO2 99%   BMI 33.00 kg/m   Constitutional:  Alert and oriented, No acute distress. HEENT: [place] AT, moist mucus membranes.  Trachea midline, no  masses. Cardiovascular: No clubbing, cyanosis, or edema. RRR Respiratory: Normal respiratory effort, no increased work of breathing. Lungs clear GI: Abdomen is soft, nontender, nondistended, no abdominal masses GU: No CVA tenderness.  Skin: No rashes, bruises or suspicious lesions. Lymph: No cervical or inguinal adenopathy. Neurologic: Grossly intact, no focal deficits, moving all 4 extremities. Psychiatric: Normal mood and affect.  Laboratory Data: No results found for: WBC, HGB, HCT, MCV, PLT  Lab Results  Component Value Date   CREATININE 1.51 (H) 05/20/2016    No results found for: PSA  No results found for: TESTOSTERONE  No results found for: HGBA1C  Urinalysis    Component Value Date/Time   APPEARANCEUR Clear 05/05/2016 1500   GLUCOSEU Negative 05/05/2016 1500   BILIRUBINUR Negative 05/05/2016 1500   PROTEINUR Negative 05/05/2016 1500   NITRITE Negative 05/05/2016 1500   LEUKOCYTESUR Trace (A) 05/05/2016 1500    Pertinent Imaging: CT reviewed as above  Assessment & Plan:    1. Bilateral ureteral stones 2. Microscopic hematuria I discussed the patient that he has bilateral ureteral stones which puts him at risk for renal failure. We'll plan to proceed today with urgent bilateral  ureteral stent placement via cystoscopy. We discussed the risks, benefits, and indications for this procedure. He understands the risks include bleeding, infection, inability to place ureteral stents. He understands that the goal be definitive stone management of his very large bilateral ureteral stones at a later date. Our goal today simply decompress his kidneys. He does understand the risks for a possible nephrostomy tube if the stone is unable to place. We'll proceed with surgery today. We'll also perform cystoscopy at the time of surgery. His microscopic hematuria is likely related to stones but this will complete his microscopic hematuria workup.   Hildred LaserBrian James , MD  [facility] [address], [address] [place], [place] [address] [phone]

## 2016-05-22 ENCOUNTER — Encounter: Payer: Self-pay | Admitting: Urology

## 2016-05-22 ENCOUNTER — Telehealth: Payer: Self-pay | Admitting: Radiology

## 2016-05-22 NOTE — Telephone Encounter (Signed)
Notified pt's wife of right antegrade ureteral stent placement in IR scheduled 05/29/16 with arrival to [facility] at 8:30. Advised pt to be npo after mn including medications (pt states losartan is taken in pm) & have a driver present for procedure. Wife voices understanding.

## 2016-05-22 NOTE — Anesthesia Postprocedure Evaluation (Signed)
Anesthesia Post Note  Patient: Eda KeysColeman H Mattos Jr.  Procedure(s) Performed: Procedure(s) (LRB): CYSTOSCOPY WITH bilateral retrorade pylogram, left ureteral stent placement, attempted inertion of right ureteral stent (Bilateral)  Patient location during evaluation: PACU Anesthesia Type: General Level of consciousness: awake and alert Pain management: pain level controlled Vital Signs Assessment: post-procedure vital signs reviewed and stable Respiratory status: spontaneous breathing, nonlabored ventilation, respiratory function stable and patient connected to nasal cannula oxygen Cardiovascular status: blood pressure returned to baseline and stable Postop Assessment: no signs of nausea or vomiting Anesthetic complications: no     Last Vitals:  Vitals:   05/21/16 1446 05/21/16 1501  BP: 139/79 (!) 155/77  Pulse: 66 68  Resp: 16   Temp: 36.2 C     Last Pain:  Vitals:   05/21/16 1446  TempSrc: Temporal  PainSc:                  Yevette EdwardsJames G 

## 2016-05-22 NOTE — Telephone Encounter (Signed)
-----   Message from Brian James Budzyn, MD sent at 05/21/2016  1:48 PM EDT ----- I was only able to get one stent in on the left. He will need non-emergent right antegrade ureteral stent placement by IR. He will then need cysto, blt urs, laser litho at least 2 weeks after stent is placed by IR. thanks 

## 2016-05-26 ENCOUNTER — Ambulatory Visit: Payer: Self-pay

## 2016-05-28 ENCOUNTER — Other Ambulatory Visit: Payer: Self-pay | Admitting: Physician Assistant

## 2016-05-29 ENCOUNTER — Ambulatory Visit
Admission: RE | Admit: 2016-05-29 | Discharge: 2016-05-29 | Disposition: A | Discharge status: Home / Self Care | Payer: Medicare Other | Source: Ambulatory Visit | Attending: Urology | Admitting: Urology

## 2016-05-29 DIAGNOSIS — N201 Calculus of ureter: Secondary | ICD-10-CM

## 2016-05-29 DIAGNOSIS — K219 Gastro-esophageal reflux disease without esophagitis: Secondary | ICD-10-CM | POA: Insufficient documentation

## 2016-05-29 DIAGNOSIS — N132 Hydronephrosis with renal and ureteral calculous obstruction: Secondary | ICD-10-CM | POA: Diagnosis present

## 2016-05-29 DIAGNOSIS — F329 Major depressive disorder, single episode, unspecified: Secondary | ICD-10-CM | POA: Insufficient documentation

## 2016-05-29 DIAGNOSIS — Z8249 Family history of ischemic heart disease and other diseases of the circulatory system: Secondary | ICD-10-CM | POA: Diagnosis not present

## 2016-05-29 DIAGNOSIS — Z7982 Long term (current) use of aspirin: Secondary | ICD-10-CM | POA: Diagnosis not present

## 2016-05-29 DIAGNOSIS — I10 Essential (primary) hypertension: Secondary | ICD-10-CM | POA: Insufficient documentation

## 2016-05-29 DIAGNOSIS — M1990 Unspecified osteoarthritis, unspecified site: Secondary | ICD-10-CM | POA: Insufficient documentation

## 2016-05-29 DIAGNOSIS — Z7951 Long term (current) use of inhaled steroids: Secondary | ICD-10-CM | POA: Insufficient documentation

## 2016-05-29 HISTORY — PX: IR URETERAL STENT RIGHT NEW ACCESS W/SEP NEPHROSTOMY CATH: IMG6078

## 2016-05-29 LAB — CBC
HEMATOCRIT: 40.7 % (ref 40.0–52.0)
HEMOGLOBIN: 13.8 g/dL (ref 13.0–18.0)
MCH: 30.7 pg (ref 26.0–34.0)
MCHC: 33.8 g/dL (ref 32.0–36.0)
MCV: 90.7 fL (ref 80.0–100.0)
Platelets: 332 10*3/uL (ref 150–440)
RBC: 4.49 MIL/uL (ref 4.40–5.90)
RDW: 13.3 % (ref 11.5–14.5)
WBC: 9.1 10*3/uL (ref 3.8–10.6)

## 2016-05-29 LAB — BASIC METABOLIC PANEL
Anion gap: 7 (ref 5–15)
BUN: 26 mg/dL — AB (ref 6–20)
CHLORIDE: 103 mmol/L (ref 101–111)
CO2: 24 mmol/L (ref 22–32)
CREATININE: 1.94 mg/dL — AB (ref 0.61–1.24)
Calcium: 8.3 mg/dL — ABNORMAL LOW (ref 8.9–10.3)
GFR calc Af Amer: 39 mL/min — ABNORMAL LOW (ref >60)
GFR calc non Af Amer: 33 mL/min — ABNORMAL LOW (ref >60)
Glucose, Bld: 104 mg/dL — ABNORMAL HIGH (ref 65–99)
Potassium: 4.1 mmol/L (ref 3.5–5.1)
Sodium: 134 mmol/L — ABNORMAL LOW (ref 135–145)

## 2016-05-29 LAB — PROTIME-INR
INR: 1.08
Prothrombin Time: 14 seconds (ref 11.4–15.2)

## 2016-05-29 LAB — APTT: aPTT: 28 seconds (ref 24–36)

## 2016-05-29 MED ORDER — CIPROFLOXACIN IN D5W 400 MG/200ML IV SOLN: 400.0000 mg | Freq: Once | INTRAVENOUS | Status: DC

## 2016-05-29 MED ORDER — HYDROCODONE-ACETAMINOPHEN 5-325 MG PO TABS: 1.0000 | ORAL_TABLET | ORAL | Status: DC | PRN

## 2016-05-29 MED ORDER — CEFAZOLIN SODIUM-DEXTROSE 2-4 GM/100ML-% IV SOLN: 2.0000 g | Freq: Once | INTRAVENOUS | Status: DC

## 2016-05-29 MED ORDER — IOPAMIDOL (ISOVUE-300) INJECTION 61%
30.0000 mL | Freq: Once | INTRAVENOUS | Status: AC | PRN
  Administered 2016-05-29: 10:00:00 10 mL via INTRAVENOUS

## 2016-05-29 MED ORDER — CEFAZOLIN SODIUM-DEXTROSE 2-4 GM/100ML-% IV SOLN
2.0000 g | Freq: Once | INTRAVENOUS | Status: AC
  Administered 2016-05-29: 2 g via INTRAVENOUS

## 2016-05-29 MED ORDER — MIDAZOLAM HCL 2 MG/2ML IJ SOLN
INTRAMUSCULAR | Status: AC | PRN
  Administered 2016-05-29 (×2): 1 mg via INTRAVENOUS

## 2016-05-29 MED ORDER — SODIUM CHLORIDE 0.9 % IV SOLN
INTRAVENOUS | Status: DC
  Administered 2016-05-29: 09:00:00 via INTRAVENOUS

## 2016-05-29 MED ORDER — FENTANYL CITRATE (PF) 100 MCG/2ML IJ SOLN
INTRAMUSCULAR | Status: AC | PRN
  Administered 2016-05-29 (×2): 50 ug via INTRAVENOUS

## 2016-05-29 NOTE — Procedures (Signed)
Interventional Radiology Procedure Note  Procedure: Right ureteral stent placement with new access of right collecting system  Complications: None  Estimated Blood Loss: < 10 mL  After perc access of right renal collecting system, antegrade guidewire access established around obstructing proximal ureteral calculus.  8.5 Fr x 28 cm JJ ureteral stent placed extending from renal pelvis to bladder.  Draining well through stent.  Perc access removed.  Jodi MarbleGlenn T. Fredia SorrowYamagata, M.D Pager:  [phone]

## 2016-05-29 NOTE — H&P (Signed)
Chief Complaint: Patient was seen in consultation today for right antegrade ureteral stent placement at the request of Hildred LaserBudzyn,Brian James  Referring Physician(s): Hildred LaserBudzyn,Brian James  Patient Status: [facility] - Out-pt  History of Present Illness: Raymond KeysColeman H Nordin Jr. is a 71 y.o. male presenting with bilateral obstructing ureteral calculi and hydronephrosis.  Status post left retrograde ureteral stent placement by Dr. Sherryl BartersBudzyn on 5/10.  Unable to pass wire around impacted right ureteral calculus during that procedure and referred for antegrade access and right ureteral stenting.  Plan is for future bialteral laser lithotripsy from retrograde ureteroscopic approach.  Past Medical History:  Diagnosis Date  . Arthritis   . Depression   . Environmental and seasonal allergies   . GERD (gastroesophageal reflux disease)   . Hypertension   . Kidney stones     Past Surgical History:  Procedure Laterality Date  . CYSTOSCOPY WITH STENT PLACEMENT Bilateral 05/21/2016   Procedure: CYSTOSCOPY WITH bilateral retrorade pylogram, left ureteral stent placement, attempted inertion of right ureteral stent;  Surgeon: Hildred LaserBudzyn, Brian James, MD;  Location: [facility] ORS;  Service: Urology;  Laterality: Bilateral;  . GREEN LIGHT LASER TURP (TRANSURETHRAL RESECTION OF PROSTATE  2013  . HAMMER TOE SURGERY    . KNEE DEBRIDEMENT Right   . LUMBAR LAMINECTOMY    . SEPTOPLASTY      Allergies: Patient has no known allergies.  Medications: Prior to Admission medications   Medication Sig Start Date End Date Taking? Authorizing Provider  aspirin EC 81 MG tablet Take by mouth.   Yes [provider]  Cinnamon 500 MG capsule Take by mouth.   Yes [provider]  doxepin (SINEQUAN) 25 MG capsule  02/25/16  Yes [provider]  fluticasone (FLONASE) 50 MCG/ACT nasal spray PLACE 2 SPRAYS INTO BOTH NOSTRILS ONCE DAILY AS NEEDED. 12/09/15  Yes [provider]  hydrochlorothiazide (MICROZIDE)  12.5 MG capsule Take 6.25 mg by mouth daily.   Yes [provider]  losartan (COZAAR) 100 MG tablet Take 100 mg by mouth daily.   Yes [provider]  losartan (COZAAR) 50 MG tablet Take by mouth. 04/20/16 04/20/17 Yes [provider]  Omega-3 1000 MG CAPS Take by mouth.   Yes [provider]  omeprazole (PRILOSEC) 20 MG capsule TAKE ONE CAPSULE BY MOUTH EVERY MORNING 04/29/16  Yes [provider]  sertraline (ZOLOFT) 50 MG tablet TAKE 1 TABLET BY MOUTH EVERY DAY 04/14/16  Yes [provider]  cephALEXin (KEFLEX) 500 MG capsule Take 1 capsule (500 mg total) by mouth 3 (three) times daily. Patient not taking: Reported on 05/29/2016 05/21/16   Hildred LaserBudzyn, Brian James, MD  oxyCODONE-acetaminophen (ROXICET) 5-325 MG tablet Take 1 tablet by mouth every 4 (four) hours as needed. Patient not taking: Reported on 05/29/2016 05/21/16 05/21/17  Hildred LaserBudzyn, Brian James, MD     Family History  Problem Relation Age of Onset  . Heart disease Father   . Heart disease Mother   . Prostate cancer Neg Hx   . Bladder Cancer Neg Hx   . Kidney cancer Neg Hx     Social History   Social History  . Marital status: Married    Spouse name: N/A  . Number of children: N/A  . Years of education: N/A   Social History Main Topics  . Smoking status: Never Smoker  . Smokeless tobacco: Former NeurosurgeonUser    Types: Chew  . Alcohol use 4.2 oz/week    7 Cans of beer per week  .  Drug use: No  . Sexual activity: Not Asked   Other Topics Concern  . None   Social History Narrative  . None    Review of Systems: A 12 point ROS discussed and pertinent positives are indicated in the HPI above.  All other systems are negative.  Review of Systems  Constitutional: Negative.   Respiratory: Negative.   Cardiovascular: Negative.   Gastrointestinal: Negative.   Genitourinary: Positive for frequency and hematuria. Negative for difficulty urinating, dysuria and flank pain.  Musculoskeletal:  Negative.   Neurological: Negative.     Vital Signs: BP (!) 147/98   Pulse 65   Temp 97.7 F (36.5 C) (Oral)   Resp 15   Ht 5\' 10"  (1.778 m)   Wt 230 lb (104.3 kg)   SpO2 100%   BMI 33.00 kg/m   Physical Exam  Constitutional: He is oriented to person, place, and time. He appears well-developed and well-nourished. No distress.  HENT:  Head: Normocephalic and atraumatic.  Neck: Neck supple. No JVD present. No tracheal deviation present. No thyromegaly present.  Cardiovascular: Normal rate, regular rhythm and normal heart sounds.  Exam reveals no gallop and no friction rub.   No murmur heard. Pulmonary/Chest: Effort normal and breath sounds normal. No respiratory distress. He has no wheezes. He has no rales.  Abdominal: Soft. Bowel sounds are normal. He exhibits no distension and no mass. There is no tenderness. There is no rebound and no guarding.  Musculoskeletal: He exhibits no edema.  Lymphadenopathy:    He has no cervical adenopathy.  Neurological: He is alert and oriented to person, place, and time.  Skin: He is not diaphoretic.  Vitals reviewed.   Mallampati Score:  MD Evaluation Airway: WNL Heart: WNL Abdomen: WNL Chest/ Lungs: WNL ASA  Classification: 2 Mallampati/Airway Score: One  Imaging: Ct Hematuria Workup  Result Date: 05/19/2016 CLINICAL DATA:  Microhematuria. EXAM: CT ABDOMEN AND PELVIS WITHOUT AND WITH CONTRAST TECHNIQUE: Multidetector CT imaging of the abdomen and pelvis was performed following the standard protocol before and following the bolus administration of intravenous contrast. CONTRAST:  ISOVUE-300 IOPAMIDOL (ISOVUE-300) INJECTION 61% COMPARISON:  CT scan 01/12/2009 FINDINGS: Lower chest: The lung bases are clear of acute process. No worrisome pulmonary lesions. The heart is normal in size. No pericardial effusion. Large hiatal hernia. Hepatobiliary: Small calcified granuloma in the right hepatic lobe but no worrisome hepatic lesions or  intrahepatic biliary dilatation. The portal and hepatic veins are patent. The gallbladder is grossly normal. No common bile duct dilatation. Pancreas: No mass, inflammation or ductal dilatation. Spleen: Normal size.  No focal lesions. Adrenals/Urinary Tract: The adrenal glands are normal. Significant bilateral hydronephrosis. Small bilateral renal calculi. There is a large obstructing UPJ calculus on the right side measuring 16.5 x 11.5 mm. The mid distal right ureter is normal in caliber. No other calculi are identified. There are multiple left ureteral calculi. The largest calculus, causing the obstruction, is near the pelvic inlet and measures 15 x 9 mm. No distal ureteral calculi and no bladder calculi. Both kidneys demonstrate symmetric enhancement. Small renal cysts but no worrisome renal lesions. The delayed images demonstrate some contrast getting into the distal ureters and bladder. The obstruction on the left side is more high-grade. No obvious collecting system abnormalities. No obvious bladder lesions. Stomach/Bowel: The stomach, duodenum, small bowel and colon are grossly normal without oral contrast. No inflammatory changes, mass lesions or obstructive findings. The terminal ileum and appendix are normal. Advanced descending and sigmoid diverticulosis  without findings for acute diverticulitis. Vascular/Lymphatic: Scattered atherosclerotic calcifications involving the aorta iliac arteries. No aneurysm. No mesenteric or retroperitoneal mass or adenopathy. Reproductive: The prostate gland and seminal vesicles are grossly normal. Other: No pelvic mass or adenopathy. No free pelvic fluid collections. No inguinal mass or adenopathy. No abdominal wall hernia or subcutaneous lesions. Musculoskeletal: No significant bony findings. IMPRESSION: 1. Significant bilateral hydronephrosis due to obstructing bilateral ureteral calculi as described above. 2. No worrisome renal or bladder lesions. 3. Moderate to large  hiatal hernia. 4. Descending and sigmoid diverticulosis without findings for acute diverticulitis. Electronically Signed   By: Rudie Meyer M.D.   On: 05/19/2016 12:18    Labs:  CBC:  Recent Labs  05/29/16 0800  WBC 9.1  HGB 13.8  HCT 40.7  PLT 332    COAGS: No results for input(s): INR, APTT in the last 8760 hours.  BMP:  Recent Labs  05/20/16 1308 05/29/16 0800  NA 137 134*  K 3.8 4.1  CL 104 103  CO2 27 24  GLUCOSE 111* 104*  BUN 15 26*  CALCIUM 8.9 8.3*  CREATININE 1.51* 1.94*  GFRNONAA 45* 33*  GFRAA 52* 39*    Assessment and Plan:  For right nephrostomy access followed by attempt at antegrade ureteral stent placement today with moderate sedation.  If unable to pass wire/stent by ureteral calculus, will leave perc nephrostomy tube in place for 1-2 weeks to allow decompression then either reattempt or have Dr. Sherryl Barters use perc approach for antegrade lithotripsy/PCNL.  Thank you for this interesting consult.  I greatly enjoyed meeting Raymond Edwards. and look forward to participating in their care.  A copy of this report was sent to the requesting provider on this date.  Electronically Signed: Reola Calkins, MD 05/29/2016, 9:05 AM     I spent a total of 30 Minutes  in face to face in clinical consultation, greater than 50% of which was counseling/coordinating care for right ureteral stent placement.

## 2016-05-29 NOTE — Discharge Instructions (Signed)
Ureteral Stent Implantation, Care After °Refer to this sheet in the next few weeks. These instructions provide you with information about caring for yourself after your procedure. Your health care provider may also give you more specific instructions. Your treatment has been planned according to current medical practices, but problems sometimes occur. Call your health care provider if you have any problems or questions after your procedure. °What can I expect after the procedure? °After the procedure, it is common to have: °· Nausea. °· Mild pain when you urinate. You may feel this pain in your lower back or lower abdomen. Pain should stop within a few minutes after you urinate. This may last for up to 1 week. °· A small amount of blood in your urine for several days. °Follow these instructions at home: °  °Medicines  °· Take over-the-counter and prescription medicines only as told by your health care provider. °· If you were prescribed an antibiotic medicine, take it as told by your health care provider. Do not stop taking the antibiotic even if you start to feel better. °· Do not drive for 24 hours if you received a sedative. °· Do not drive or operate heavy machinery while taking prescription pain medicines. °Activity  °· Return to your normal activities as told by your health care provider. Ask your health care provider what activities are safe for you. °· Do not lift anything that is heavier than 10 lb (4.5 kg). Follow this limit for 1 week after your procedure, or for as long as told by your health care provider. °General instructions  °· Watch for any blood in your urine. Call your health care provider if the amount of blood in your urine increases. °· If you have a catheter: °¨ Follow instructions from your health care provider about taking care of your catheter and collection bag. °¨ Do not take baths, swim, or use a hot tub until your health care provider approves. °· Drink enough fluid to keep your urine  clear or pale yellow. °· Keep all follow-up visits as told by your health care provider. This is important. °Contact a health care provider if: °· You have pain that gets worse or does not get better with medicine, especially pain when you urinate. °· You have difficulty urinating. °· You feel nauseous or you vomit repeatedly during a period of more than 2 days after the procedure. °Get help right away if: °· Your urine is dark red or has blood clots in it. °· You are leaking urine (have incontinence). °· The end of the stent comes out of your urethra. °· You cannot urinate. °· You have sudden, sharp, or severe pain in your abdomen or lower back. °· You have a fever. °This information is not intended to replace advice given to you by your health care provider. Make sure you discuss any questions you have with your health care provider. °Document Released: 08/31/2012 Document Revised: 06/06/2015 Document Reviewed: 07/13/2014 °Elsevier Interactive Patient Education © 2017 [employer] ° °

## 2016-06-01 ENCOUNTER — Telehealth: Payer: Self-pay | Admitting: Radiology

## 2016-06-01 ENCOUNTER — Other Ambulatory Visit: Payer: Self-pay | Admitting: Radiology

## 2016-06-01 DIAGNOSIS — N201 Calculus of ureter: Secondary | ICD-10-CM

## 2016-06-01 NOTE — Telephone Encounter (Signed)
Notified pt's wife of surgery scheduled with Dr Sherryl BartersBudzyn on 06/19/16, pre-admit testing appt on 06/04/16 @10 :30 (d/t pt is going out of town 5/25-06/17/16) & to call day prior to surgery for arrival time to [facility]. Wife voices understanding.

## 2016-06-01 NOTE — Telephone Encounter (Signed)
-----   Message from Hildred LaserBrian James Budzyn, MD sent at 05/21/2016  1:48 PM EDT ----- I was only able to get one stent in on the left. He will need non-emergent right antegrade ureteral stent placement by IR. He will then need cysto, blt urs, laser litho at least 2 weeks after stent is placed by IR. thanks

## 2016-06-01 NOTE — Telephone Encounter (Signed)
LMOM. Need to discuss surgery information. 

## 2016-06-04 ENCOUNTER — Encounter
Admission: RE | Admit: 2016-06-04 | Discharge: 2016-06-04 | Disposition: A | Discharge status: Home / Self Care | Payer: Medicare Other | Source: Ambulatory Visit | Attending: Urology | Admitting: Urology

## 2016-06-04 DIAGNOSIS — K219 Gastro-esophageal reflux disease without esophagitis: Secondary | ICD-10-CM | POA: Insufficient documentation

## 2016-06-04 DIAGNOSIS — F329 Major depressive disorder, single episode, unspecified: Secondary | ICD-10-CM | POA: Insufficient documentation

## 2016-06-04 DIAGNOSIS — I10 Essential (primary) hypertension: Secondary | ICD-10-CM | POA: Diagnosis not present

## 2016-06-04 DIAGNOSIS — Z87442 Personal history of urinary calculi: Secondary | ICD-10-CM | POA: Diagnosis not present

## 2016-06-04 DIAGNOSIS — Z01812 Encounter for preprocedural laboratory examination: Secondary | ICD-10-CM | POA: Insufficient documentation

## 2016-06-04 HISTORY — DX: Personal history of urinary calculi: Z87.442

## 2016-06-04 NOTE — Patient Instructions (Signed)
  Your procedure is scheduled on: 06/19/16 Report to Same Day Surgery 2nd floor medical mall [facility](Medical Mall Entrance-take elevator on left to 2nd floor.  Check in with surgery information desk.) To find out your arrival time please call [phone] between 1PM - 3PM on 06/18/16  Remember: Instructions that are not followed completely may result in serious medical risk, up to and including death, or upon the discretion of your surgeon and anesthesiologist your surgery may need to be rescheduled.    _x___ 1. Do not eat food or drink liquids after midnight. No gum chewing or                              hard candies.     __x__ 2. No Alcohol for 24 hours before or after surgery.   __x__3. No Smoking for 24 prior to surgery.   ____  4. Bring all medications with you on the day of surgery if instructed.    __x__ 5. Notify your doctor if there is any change in your medical condition     (cold, fever, infections).     Do not wear jewelry, make-up, hairpins, clips or nail polish.  Do not wear lotions, powders, or perfumes. You may wear deodorant.  Do not shave 48 hours prior to surgery. Men may shave face and neck.  Do not bring valuables to the hospital.    [facility] is not responsible for any belongings or valuables.               Contacts, dentures or bridgework may not be worn into surgery.  Leave your suitcase in the car. After surgery it may be brought to your room.  For patients admitted to the hospital, discharge time is determined by your                       treatment team.   Patients discharged the day of surgery will not be allowed to drive home.  You will need someone to drive you home and stay with you the night of your procedure.    Please read over the following fact sheets that you were given:   [facility] Preparing for [facility]   _x___ Take anti-hypertensive (unless it includes a diuretic), cardiac, seizure, asthma,     anti-reflux and psychiatric medicines. These  include:  1. PRILOSEC  2. SERTRALINE  3.  4.  5.  6.  ____Fleets enema or Magnesium Citrate as directed.   ____ Use CHG Soap or sage wipes as directed on instruction sheet   ____ Use inhalers on the day of surgery and bring to hospital day of surgery  ____ Stop Metformin and Janumet 2 days prior to surgery.    ____ Take 1/2 of usual insulin dose the night before surgery and none on the morning     surgery.   _x___ Follow recommendations from Cardiologist, Pulmonologist or PCP regarding          stopping Aspirin, Coumadin, Pllavix ,Eliquis, Effient, or Pradaxa, and Pletal.  X____Stop Anti-inflammatories such as Advil, Aleve, Ibuprofen, Motrin, Naproxen, Naprosyn, Goodies powders or aspirin products. OK to take Tylenol an D                Celebrex.   _x___ Stop supplements until after surgery.    STOP OMEGA  ____ Bring C-Pap to the hospital.

## 2016-06-05 LAB — URINE CULTURE: CULTURE: NO GROWTH

## 2016-06-09 ENCOUNTER — Other Ambulatory Visit: Payer: Self-pay

## 2016-06-18 MED ORDER — CEFAZOLIN SODIUM-DEXTROSE 2-4 GM/100ML-% IV SOLN
2.0000 g | INTRAVENOUS | Status: AC
  Administered 2016-06-19: 2 g via INTRAVENOUS

## 2016-06-19 ENCOUNTER — Encounter: Payer: Self-pay | Admitting: *Deleted

## 2016-06-19 ENCOUNTER — Ambulatory Visit: Payer: Medicare Other | Admitting: Anesthesiology

## 2016-06-19 ENCOUNTER — Telehealth: Payer: Self-pay | Admitting: Urology

## 2016-06-19 ENCOUNTER — Ambulatory Visit
Admission: RE | Admit: 2016-06-19 | Discharge: 2016-06-19 | Disposition: A | Discharge status: Home / Self Care | Payer: Medicare Other | Source: Ambulatory Visit | Attending: Urology | Admitting: Urology

## 2016-06-19 ENCOUNTER — Encounter
Admission: RE | Disposition: A | Discharge status: Home / Self Care | Payer: Self-pay | Source: Ambulatory Visit | Attending: Urology

## 2016-06-19 DIAGNOSIS — E669 Obesity, unspecified: Secondary | ICD-10-CM | POA: Diagnosis not present

## 2016-06-19 DIAGNOSIS — I10 Essential (primary) hypertension: Secondary | ICD-10-CM | POA: Insufficient documentation

## 2016-06-19 DIAGNOSIS — M1990 Unspecified osteoarthritis, unspecified site: Secondary | ICD-10-CM | POA: Diagnosis not present

## 2016-06-19 DIAGNOSIS — Z6833 Body mass index (BMI) 33.0-33.9, adult: Secondary | ICD-10-CM | POA: Insufficient documentation

## 2016-06-19 DIAGNOSIS — F329 Major depressive disorder, single episode, unspecified: Secondary | ICD-10-CM | POA: Insufficient documentation

## 2016-06-19 DIAGNOSIS — Z87442 Personal history of urinary calculi: Secondary | ICD-10-CM | POA: Insufficient documentation

## 2016-06-19 DIAGNOSIS — R3129 Other microscopic hematuria: Secondary | ICD-10-CM | POA: Insufficient documentation

## 2016-06-19 DIAGNOSIS — K219 Gastro-esophageal reflux disease without esophagitis: Secondary | ICD-10-CM | POA: Diagnosis not present

## 2016-06-19 DIAGNOSIS — Z8249 Family history of ischemic heart disease and other diseases of the circulatory system: Secondary | ICD-10-CM | POA: Diagnosis not present

## 2016-06-19 DIAGNOSIS — N201 Calculus of ureter: Secondary | ICD-10-CM | POA: Insufficient documentation

## 2016-06-19 DIAGNOSIS — Z87891 Personal history of nicotine dependence: Secondary | ICD-10-CM | POA: Insufficient documentation

## 2016-06-19 HISTORY — PX: URETEROSCOPY WITH HOLMIUM LASER LITHOTRIPSY: SHX6645

## 2016-06-19 HISTORY — PX: CYSTOSCOPY W/ URETERAL STENT PLACEMENT: SHX1429

## 2016-06-19 SURGERY — URETEROSCOPY, WITH LITHOTRIPSY USING HOLMIUM LASER
Anesthesia: General | Laterality: Bilateral

## 2016-06-19 MED ORDER — LACTATED RINGERS IV SOLN: INTRAVENOUS | Status: DC

## 2016-06-19 MED ORDER — ONDANSETRON HCL 4 MG/2ML IJ SOLN
INTRAMUSCULAR | Status: DC | PRN
  Administered 2016-06-19: 4 mg via INTRAVENOUS

## 2016-06-19 MED ORDER — FENTANYL CITRATE (PF) 100 MCG/2ML IJ SOLN
INTRAMUSCULAR | Status: DC | PRN
  Administered 2016-06-19: 25 ug via INTRAVENOUS
  Administered 2016-06-19 (×2): 50 ug via INTRAVENOUS
  Administered 2016-06-19: 25 ug via INTRAVENOUS

## 2016-06-19 MED ORDER — EPHEDRINE SULFATE 50 MG/ML IJ SOLN
INTRAMUSCULAR | Status: DC | PRN
  Administered 2016-06-19: 15 mg via INTRAVENOUS

## 2016-06-19 MED ORDER — PROPOFOL 10 MG/ML IV BOLUS
INTRAVENOUS | Status: DC | PRN
Start: 2016-06-19 — End: 2016-06-19
  Administered 2016-06-19: 150 mg via INTRAVENOUS
  Administered 2016-06-19: 20 mg via INTRAVENOUS

## 2016-06-19 MED ORDER — ROCURONIUM BROMIDE 100 MG/10ML IV SOLN
INTRAVENOUS | Status: DC | PRN
  Administered 2016-06-19: 10 mg via INTRAVENOUS
  Administered 2016-06-19: 40 mg via INTRAVENOUS
  Administered 2016-06-19: 10 mg via INTRAVENOUS

## 2016-06-19 MED ORDER — SEVOFLURANE IN SOLN
RESPIRATORY_TRACT | Status: AC
  Filled 2016-06-19: qty 250

## 2016-06-19 MED ORDER — FENTANYL CITRATE (PF) 100 MCG/2ML IJ SOLN
INTRAMUSCULAR | Status: AC
  Filled 2016-06-19: qty 2

## 2016-06-19 MED ORDER — LIDOCAINE HCL (CARDIAC) 20 MG/ML IV SOLN
INTRAVENOUS | Status: DC | PRN
  Administered 2016-06-19: 40 mg via INTRAVENOUS

## 2016-06-19 MED ORDER — CEPHALEXIN 500 MG PO CAPS: 500.0000 mg | ORAL_CAPSULE | Freq: Three times a day (TID) | ORAL | 0 refills | Status: DC

## 2016-06-19 MED ORDER — PROPOFOL 10 MG/ML IV BOLUS
INTRAVENOUS | Status: AC
Start: 2016-06-19 — End: 2016-06-19
  Filled 2016-06-19: qty 20

## 2016-06-19 MED ORDER — MIDAZOLAM HCL 2 MG/2ML IJ SOLN
INTRAMUSCULAR | Status: DC | PRN
  Administered 2016-06-19: 2 mg via INTRAVENOUS

## 2016-06-19 MED ORDER — MIDAZOLAM HCL 2 MG/2ML IJ SOLN
INTRAMUSCULAR | Status: AC
  Filled 2016-06-19: qty 2

## 2016-06-19 MED ORDER — CEFAZOLIN SODIUM-DEXTROSE 2-4 GM/100ML-% IV SOLN
INTRAVENOUS | Status: AC
  Filled 2016-06-19: qty 100

## 2016-06-19 MED ORDER — PHENYLEPHRINE HCL 10 MG/ML IJ SOLN
INTRAMUSCULAR | Status: DC | PRN
  Administered 2016-06-19 (×2): 100 ug via INTRAVENOUS

## 2016-06-19 MED ORDER — ONDANSETRON HCL 4 MG/2ML IJ SOLN
INTRAMUSCULAR | Status: AC
  Filled 2016-06-19: qty 2

## 2016-06-19 MED ORDER — LACTATED RINGERS IV SOLN
INTRAVENOUS | Status: DC
  Administered 2016-06-19 (×2): via INTRAVENOUS

## 2016-06-19 MED ORDER — PROPOFOL 10 MG/ML IV BOLUS: INTRAVENOUS | Status: DC | PRN

## 2016-06-19 MED ORDER — OXYCODONE-ACETAMINOPHEN 5-325 MG PO TABS: 1.0000 | ORAL_TABLET | ORAL | 0 refills | Status: AC | PRN

## 2016-06-19 MED ORDER — SUGAMMADEX SODIUM 200 MG/2ML IV SOLN
INTRAVENOUS | Status: DC | PRN
  Administered 2016-06-19: 210 mg via INTRAVENOUS

## 2016-06-19 SURGICAL SUPPLY — 29 items
BACTOSHIELD CHG 4% 4OZ (MISCELLANEOUS) ×2
BASKET ZERO TIP 1.9FR (BASKET) ×3 IMPLANT
CATH URETL 5X70 OPEN END (CATHETERS) ×3 IMPLANT
CNTNR SPEC 2.5X3XGRAD LEK (MISCELLANEOUS) ×1
CONT SPEC 4OZ STER OR WHT (MISCELLANEOUS) ×2
CONTAINER SPEC 2.5X3XGRAD LEK (MISCELLANEOUS) ×1 IMPLANT
FEE TECHNICIAN ONLY PER HOUR (MISCELLANEOUS) IMPLANT
FIBER LASER LITHO 273 (Laser) ×6 IMPLANT
GLOVE BIO SURGEON STRL SZ7 (GLOVE) ×3 IMPLANT
GLOVE BIO SURGEON STRL SZ7.5 (GLOVE) ×3 IMPLANT
GOWN STRL REUS W/ TWL LRG LVL4 (GOWN DISPOSABLE) ×1 IMPLANT
GOWN STRL REUS W/TWL LRG LVL4 (GOWN DISPOSABLE) ×2
GOWN STRL REUS W/TWL XL LVL3 (GOWN DISPOSABLE) ×3 IMPLANT
GUIDEWIRE SUPER STIFF (WIRE) IMPLANT
INTRODUCER DILATOR DOUBLE (INTRODUCER) ×3 IMPLANT
KIT RM TURNOVER CYSTO AR (KITS) ×3 IMPLANT
PACK CYSTO AR (MISCELLANEOUS) ×3 IMPLANT
SCRUB CHG 4% DYNA-HEX 4OZ (MISCELLANEOUS) ×1 IMPLANT
SENSORWIRE 0.038 NOT ANGLED (WIRE) ×3
SET CYSTO W/LG BORE CLAMP LF (SET/KITS/TRAYS/PACK) ×3 IMPLANT
SHEATH URETERAL 13/15X36 1L (SHEATH) IMPLANT
SOL .9 NS 3000ML IRR  AL (IV SOLUTION) ×2
SOL .9 NS 3000ML IRR UROMATIC (IV SOLUTION) ×1 IMPLANT
STENT URET 6FRX24 CONTOUR (STENTS) IMPLANT
STENT URET 6FRX26 CONTOUR (STENTS) ×6 IMPLANT
SURGILUBE 2OZ TUBE FLIPTOP (MISCELLANEOUS) ×3 IMPLANT
SYRINGE IRR TOOMEY STRL 70CC (SYRINGE) ×3 IMPLANT
WATER STERILE IRR 1000ML POUR (IV SOLUTION) ×3 IMPLANT
WIRE SENSOR 0.038 NOT ANGLED (WIRE) ×1 IMPLANT

## 2016-06-19 NOTE — H&P (View-Only) (Signed)
11:51 AM   Raymond Keysoleman H Barabas Jr. 28-Feb-1945 [id number]  CC: Bilateral ureteral stones  HPI: The patient is a 71 year old gentleman who presents today for urgent bilateral ureteral stent placement via cystoscopy after being diagnosed with bilateral ureteral stones on CT urogram performed for microscopic hematuria. He has a large 1.6 cm UPJ stone on the right.  His creatinine is 1.5 which is surprisingly good given how large the bilateral obstructing stones are. Though it is slightly raised from baseline. He currently is voiding. At this time, we'll proceed with bilateral ureteral stent placement and cystoscopy.   PMH: Past Medical History:  Diagnosis Date  . Arthritis   . Depression   . Environmental and seasonal allergies   . GERD (gastroesophageal reflux disease)   . Hypertension   . Kidney stones     Surgical History: Past Surgical History:  Procedure Laterality Date  . GREEN LIGHT LASER TURP (TRANSURETHRAL RESECTION OF PROSTATE  2013  . HAMMER TOE SURGERY    . KNEE DEBRIDEMENT Right   . LUMBAR LAMINECTOMY    . SEPTOPLASTY        Allergies: No Known Allergies  Family History: Family History  Problem Relation Age of Onset  . Heart disease Father   . Heart disease Mother   . Prostate cancer Neg Hx   . Bladder Cancer Neg Hx   . Kidney cancer Neg Hx     Social History:  reports that he has never smoked. He has quit using smokeless tobacco. His smokeless tobacco use included Chew. He reports that he drinks about 4.2 oz of alcohol per week . He reports that he does not use drugs.  ROS: 12 point ROS negative except for above                                        Physical Exam: BP (!) 181/100   Pulse 78   Temp 98 F (36.7 C) (Oral)   Resp 18   Ht 5\' 10"  (1.778 m)   Wt 230 lb (104.3 kg)   SpO2 99%   BMI 33.00 kg/m   Constitutional:  Alert and oriented, No acute distress. HEENT: [place] AT, moist mucus membranes.  Trachea midline, no  masses. Cardiovascular: No clubbing, cyanosis, or edema. RRR Respiratory: Normal respiratory effort, no increased work of breathing. Lungs clear GI: Abdomen is soft, nontender, nondistended, no abdominal masses GU: No CVA tenderness.  Skin: No rashes, bruises or suspicious lesions. Lymph: No cervical or inguinal adenopathy. Neurologic: Grossly intact, no focal deficits, moving all 4 extremities. Psychiatric: Normal mood and affect.  Laboratory Data: No results found for: WBC, HGB, HCT, MCV, PLT  Lab Results  Component Value Date   CREATININE 1.51 (H) 05/20/2016    No results found for: PSA  No results found for: TESTOSTERONE  No results found for: HGBA1C  Urinalysis    Component Value Date/Time   APPEARANCEUR Clear 05/05/2016 1500   GLUCOSEU Negative 05/05/2016 1500   BILIRUBINUR Negative 05/05/2016 1500   PROTEINUR Negative 05/05/2016 1500   NITRITE Negative 05/05/2016 1500   LEUKOCYTESUR Trace (A) 05/05/2016 1500    Pertinent Imaging: CT reviewed as above  Assessment & Plan:    1. Bilateral ureteral stones 2. Microscopic hematuria I discussed the patient that he has bilateral ureteral stones which puts him at risk for renal failure. We'll plan to proceed today with urgent bilateral  ureteral stent placement via cystoscopy. We discussed the risks, benefits, and indications for this procedure. He understands the risks include bleeding, infection, inability to place ureteral stents. He understands that the goal be definitive stone management of his very large bilateral ureteral stones at a later date. Our goal today simply decompress his kidneys. He does understand the risks for a possible nephrostomy tube if the stone is unable to place. We'll proceed with surgery today. We'll also perform cystoscopy at the time of surgery. His microscopic hematuria is likely related to stones but this will complete his microscopic hematuria workup.   Hildred LaserBrian James , MD  [facility] [address], [address] [place], [place] [address] [phone]

## 2016-06-19 NOTE — Discharge Instructions (Signed)

## 2016-06-19 NOTE — Interval H&P Note (Signed)
History and Physical Interval Note:  06/19/2016 10:41 AM  Raymond Keysoleman H Goodspeed Jr.  has presented today for surgery, with the diagnosis of BILATERAL URETERAL STONES  The various methods of treatment have been discussed with the patient and family. After consideration of risks, benefits and other options for treatment, the patient has consented to  Procedure(s): URETEROSCOPY WITH HOLMIUM LASER LITHOTRIPSY (Bilateral) CYSTOSCOPY WITH STENT REPLACEMENT (Bilateral) as a surgical intervention .  The patient's history has been reviewed, patient examined, no change in status, stable for surgery.  I have reviewed the patient's chart and labs.  Questions were answered to the patient's satisfaction.    RRR Lungs clear  Presents for definitive stone management after bilateral stent placement with URS, laser litho.  Hildred LaserBrian James 

## 2016-06-19 NOTE — Op Note (Signed)
Date of procedure: 06/19/16  Preoperative diagnosis:  1. Bilateral ureteral calculi   Postoperative diagnosis:  1. Same   Procedure: 1. Cystoscopy 2. Bilateral ureteroscopy 3. Laser lithotripsy 4. Stone basketing 5. Bilateral retrograde pyelogram with interpretation 6. Bilateral ureteral stent exchange 6 [place] by 26 cm  Surgeon: Baruch Gouty, MD  Anesthesia: General  Complications: None  Intraoperative findings: The patient had approximately 1 cm left mid ureteral calculus and a 1 cm right UPJ calculus that were successfully removed. Bilateral retrograde pyelograms at the end of stone removal on both sides showed no filling defects suggesting further stone burden.  EBL: None  Specimens: Stones to lab for analysis  Drains: Bilateral 6 French by 26 cm double-J ureteral stents  Disposition: Stable to the postanesthesia care unit  Indication for procedure: The patient is a 71 y.o. male with history of bilateral obstructing 1 cm ureteral stone status post left retrograde ureteral stent placement and right antegrade ureteral stent placement presents today for definitive surgical management.  After reviewing the management options for treatment, the patient elected to proceed with the above surgical procedure(s). We have discussed the potential benefits and risks of the procedure, side effects of the proposed treatment, the likelihood of the patient achieving the goals of the procedure, and any potential problems that might occur during the procedure or recuperation. Informed consent has been obtained.  Description of procedure: The patient was met in the preoperative area. All risks, benefits, and indications of the procedure were described in great detail. The patient consented to the procedure. Preoperative antibiotics were given. The patient was taken to the operative theater. General anesthesia was induced per the anesthesia service. The patient was then placed in the dorsal  lithotomy position and prepped and draped in the usual sterile fashion. A preoperative timeout was called.   A 21 French 30 cystoscope was inserted into the patient's bladder per urethra atraumatically. The left ureteral stent was grasped flexible graspers and brought to level the urethral meatus. A sensor wire was exchanged to advance the level of the left renal pelvis under fluoroscopy. The semirigid ureteroscope was then introduced into the bladder and introduced in the left ureteral orifice. The stone was encountered in the mid ureter. As approximately one centimeters in size. It was broken into smaller fragments with laser lithotripsy. All large fragments were removed with stone basketing. Pan ureteroscopy at this point revealed no further stone burden. A left retropyelogram confirm this with no filling defects. At this point the ureteroscope was withdrawn, and the cystoscope was assembled over the sensor wire. A 6 French by 26 cm double-J ureteral stent was then placed and the sensor wire removed. A curl was in the patient's left renal pelvis on fluoroscopy and in the urinary bladder under direct visualization.  In an identical fashion, the right ureteral stent was exchanged for a sensor wire, and the semirigid ureteroscope was advanced into the right ureter. The one centimeters stone was encountered in the proximal ureter. This broken into smaller pieces with laser lithotripsy, however many of the fragments migrated proximally into the renal pelvis as it was reviewed J stone. Once no fragment were visible with the seminal ureteroscope, and pain ureteroscopy was performed and showed no stones in the entirety of the right ureter. A second sensor wire was placed up to the level of the right renal pelvis under fluoroscopy. The semirigid ureteroscope was withdrawn. A ureteral access sheath was then placed over one of the sensor wires advanced the level of  the proximal ureter under fluoroscopy atraumatically.  A flexible ureteroscope was then inserted through the access sheath. All residual stones found within the right collecting system were then removed with the stone basket or dusted until there was no fragment greater than 2 mm. Right retrograde pyelogram was then obtained at this point to identify the collecting system showed no filling defects. The ureteral access sheath was removed under direct position revealing no trauma or residual stones within the right ureter. A right ureteral stent was placed identical fashion as on the contralateral side. A curl was seen in the patient's right renal pelvis on fluoroscopy and in the urinary bladder direct visualization. At this point all stone from its were evacuated and migrated to the bladder. Patient's bladder was drained. Stone was sent to the lab for analysis. His wound from anesthesia and transferred in stable condition to postanesthesia care unit.  Plan: The patient follow up in 2 weeks for bilateral ureteral stent removal in the office. He will need a renal ultrasound month following removal to rule out iatrogenic hydronephrosis.  Baruch Gouty, M.D.

## 2016-06-19 NOTE — Anesthesia Post-op Follow-up Note (Cosign Needed)
Anesthesia QCDR form completed.        

## 2016-06-19 NOTE — Transfer of Care (Signed)
Immediate Anesthesia Transfer of Care Note  Patient: Raymond KeysColeman H Hostetter Jr.  Procedure(s) Performed: Procedure(s): URETEROSCOPY WITH HOLMIUM LASER LITHOTRIPSY (Bilateral) CYSTOSCOPY WITH STENT REPLACEMENT (Bilateral)  Patient Location: PACU  Anesthesia Type:General  Level of Consciousness: awake  Airway & Oxygen Therapy: Patient Spontanous Breathing and Patient connected to face mask oxygen  Post-op Assessment: Report given to RN and Post -op Vital signs reviewed and stable  Post vital signs: Reviewed and stable  Last Vitals:  Vitals:   06/19/16 0906 06/19/16 1305  BP: (!) 143/80 118/64  Pulse: 62 71  Resp: 18 14  Temp: 36.8 C 36.7 C    Last Pain:  Vitals:   06/19/16 1305  TempSrc:   PainSc: 0-No pain         Complications: No apparent anesthesia complications

## 2016-06-19 NOTE — Anesthesia Procedure Notes (Signed)
Procedure Name: Intubation Date/Time: 06/19/2016 11:10 AM Performed by: Allean Found Pre-anesthesia Checklist: Patient identified, Emergency Drugs available, Suction available, Patient being monitored and Timeout performed Patient Re-evaluated:Patient Re-evaluated prior to inductionOxygen Delivery Method: Circle system utilized Preoxygenation: Pre-oxygenation with 100% oxygen Intubation Type: IV induction Ventilation: Mask ventilation without difficulty Laryngoscope Size: Mac and 3 Grade View: Grade I Tube type: Oral Tube size: 7.5 mm Number of attempts: 1 Airway Equipment and Method: Stylet Placement Confirmation: ETT inserted through vocal cords under direct vision,  positive ETCO2 and breath sounds checked- equal and bilateral Secured at: 23.5 cm Tube secured with: Tape Dental Injury: Teeth and Oropharynx as per pre-operative assessment

## 2016-06-19 NOTE — Telephone Encounter (Signed)
-----   Message from Hildred LaserBrian James Budzyn, MD sent at 06/19/2016  1:06 PM EDT ----- Patient needs to f/u in 2 weeks for cysto stent removal. I also told the wife to come by the office to pick up a couple boxes of myrbetriq to get him through stent removal. thanks

## 2016-06-19 NOTE — Anesthesia Preprocedure Evaluation (Signed)
Anesthesia Evaluation  Patient identified by MRN, date of birth, ID band Patient awake    Reviewed: Allergy & Precautions, NPO status , Patient's Chart, lab work & pertinent test results  Airway Mallampati: II       Dental  (+) Teeth Intact   Pulmonary neg pulmonary ROS,    breath sounds clear to auscultation       Cardiovascular Exercise Tolerance: Good hypertension, Pt. on medications  Rhythm:Regular Rate:Normal     Neuro/Psych Depression negative neurological ROS     GI/Hepatic Neg liver ROS, GERD  Medicated,  Endo/Other  negative endocrine ROS  Renal/GU      Musculoskeletal   Abdominal (+) + obese,   Peds negative pediatric ROS (+)  Hematology negative hematology ROS (+)   Anesthesia Other Findings   Reproductive/Obstetrics                             Anesthesia Physical Anesthesia Plan  ASA: II  Anesthesia Plan: General   Post-op Pain Management:    Induction: Intravenous  PONV Risk Score and Plan: 1 and Ondansetron and Treatment may vary due to age  Airway Management Planned: Oral ETT  Additional Equipment:   Intra-op Plan:   Post-operative Plan: Extubation in OR  Informed Consent: I have reviewed the patients History and Physical, chart, labs and discussed the procedure including the risks, benefits and alternatives for the proposed anesthesia with the patient or authorized representative who has indicated his/her understanding and acceptance.     Plan Discussed with: CRNA  Anesthesia Plan Comments:         Anesthesia Quick Evaluation

## 2016-06-19 NOTE — Telephone Encounter (Signed)
App made and gave 3 sleeves of Myrbetriq 50mg  per Dr. Sherryl BartersBudzyn Lot #[id number] EXP 02-2018   Left up front for patient's wife to pick up   Gave sample information to Cassandra to put in the sample log book  Marcelino DusterMichelle

## 2016-06-21 ENCOUNTER — Encounter: Payer: Self-pay | Admitting: Urology

## 2016-06-23 NOTE — Anesthesia Postprocedure Evaluation (Signed)
Anesthesia Post Note  Patient: Raymond KeysColeman H Schreurs Jr.  Procedure(s) Performed: Procedure(s) (LRB): URETEROSCOPY WITH HOLMIUM LASER LITHOTRIPSY (Bilateral) CYSTOSCOPY WITH STENT REPLACEMENT (Bilateral)  Patient location during evaluation: PACU Anesthesia Type: General Level of consciousness: awake Pain management: pain level controlled Vital Signs Assessment: post-procedure vital signs reviewed and stable Respiratory status: spontaneous breathing Cardiovascular status: stable Anesthetic complications: no     Last Vitals:  Vitals:   06/19/16 1355 06/19/16 1420  BP: (!) 147/99 (!) 142/76  Pulse: 62 62  Resp: 20 18  Temp: 36.4 C 36.4 C    Last Pain:  Vitals:   06/19/16 1355  TempSrc:   PainSc: 5                  VAN STAVEREN,

## 2016-06-24 ENCOUNTER — Other Ambulatory Visit: Payer: Self-pay | Admitting: Urology

## 2016-06-24 MED ORDER — OXYBUTYNIN CHLORIDE 5 MG PO TABS: 5.0000 mg | ORAL_TABLET | Freq: Three times a day (TID) | ORAL | 0 refills | Status: DC | PRN

## 2016-06-29 LAB — STONE ANALYSIS
CA OXALATE, MONOHYDR.: 60 %
CA PHOS CRY STONE QL IR: 15 %
Ca Oxalate,Dihydrate: 25 %
STONE WEIGHT KSTONE: 377.1 mg

## 2016-07-03 ENCOUNTER — Ambulatory Visit (INDEPENDENT_AMBULATORY_CARE_PROVIDER_SITE_OTHER): Payer: Medicare Other | Admitting: Urology

## 2016-07-03 ENCOUNTER — Encounter: Payer: Self-pay | Admitting: Urology

## 2016-07-03 VITALS — BP 118/80 | HR 66 | Ht 69.0 in | Wt 226.4 lb

## 2016-07-03 DIAGNOSIS — N200 Calculus of kidney: Secondary | ICD-10-CM | POA: Diagnosis not present

## 2016-07-03 LAB — URINALYSIS, COMPLETE
BILIRUBIN UA: NEGATIVE
GLUCOSE, UA: NEGATIVE
Ketones, UA: NEGATIVE
Nitrite, UA: NEGATIVE
Specific Gravity, UA: >1.03 — ABNORMAL HIGH (ref 1.005–1.030)
UUROB: 0.2 mg/dL (ref 0.2–1.0)
pH, UA: 5.5 (ref 5.0–7.5)

## 2016-07-03 LAB — MICROSCOPIC EXAMINATION: RBC, UA: >30 /hpf — ABNORMAL HIGH (ref 0–?)

## 2016-07-03 NOTE — Progress Notes (Signed)
   07/03/16  CC:  Chief Complaint  Patient presents with  . Hematuria    HPI: The patient presents for bilateral ureteral stent removal after undergoing ureteroscopy bilaterally for 1 cm stones on each side.  Blood pressure 118/80, pulse 66, height 5\' 9"  (1.753 m), weight 226 lb 6.4 oz (102.7 kg). NED. A&Ox3.   No respiratory distress   Abd soft, NT, ND Normal phallus with bilateral descended testicles  Cystoscopy Procedure Note  Patient identification was confirmed, informed consent was obtained, and patient was prepped using Betadine solution.  Lidocaine jelly was administered per urethral meatus.    Preoperative abx where received prior to procedure.     Pre-Procedure: - Inspection reveals a normal caliber ureteral meatus.  Procedure: The flexible cystoscope was introduced without difficulty - No urethral strictures/lesions are present. - Bilateral ureteral stents removed individually with flexible graspers per urethral meatus intact.   Post-Procedure: - Patient tolerated the procedure well  Assessment/ Plan:  1. History of nephrolithiasis The patient will follow-up in one month with a renal ultrasound prior to rule out iatrogenic hydronephrosis. We will go over her stone analysis at that time when it is available.

## 2016-07-28 ENCOUNTER — Ambulatory Visit
Admission: RE | Admit: 2016-07-28 | Discharge: 2016-07-28 | Disposition: A | Discharge status: Home / Self Care | Payer: Medicare Other | Source: Ambulatory Visit | Attending: Urology | Admitting: Urology

## 2016-07-28 DIAGNOSIS — N200 Calculus of kidney: Secondary | ICD-10-CM | POA: Diagnosis present

## 2016-07-30 ENCOUNTER — Encounter: Payer: Self-pay | Admitting: Urology

## 2016-07-30 ENCOUNTER — Ambulatory Visit (INDEPENDENT_AMBULATORY_CARE_PROVIDER_SITE_OTHER): Payer: Medicare Other | Admitting: Urology

## 2016-07-30 VITALS — BP 138/78 | HR 67 | Ht 69.0 in | Wt 225.7 lb

## 2016-07-30 DIAGNOSIS — N200 Calculus of kidney: Secondary | ICD-10-CM

## 2016-07-30 NOTE — Progress Notes (Signed)
07/30/2016 11:04 AM   Raymond Keysoleman H Schooley Jr. January 10, 1946 [id number]  Referring provider: Marisue IvanLinthavong, [place], MD [phone] [facility] [address] [facility] [place], [place] [id number]  Chief Complaint  Patient presents with  . Follow-up    RUS results    HPI: The patient is a 71 year old male who underwent bilateral ureteroscopy for bilateral 1 cm ureteral stones or prostate 1 month ago. He presents today for post-instantaneous renal ultrasound which was normal with no sign of hydronephrosis.  Stone analysis revealed 25% calcium oxalate dihydrate, 6% calcium oxalate monohydrate, and 15% calcium phosphate.  Follow-up ultrasound images were reviewed. There was no hydronephrosis. There were 2 echogenic areas with one on each side that were concerning for a possible stone per the radiology report. However, on direct visualization one month ago there was no large stones left with this collecting system. Therefore these are likely artifacts.   PMH: Past Medical History:  Diagnosis Date  . Arthritis   . Depression   . Environmental and seasonal allergies   . GERD (gastroesophageal reflux disease)   . History of kidney stones   . Hypertension   . Kidney stones     Surgical History: Past Surgical History:  Procedure Laterality Date  . BACK SURGERY    . CYSTOSCOPY W/ URETERAL STENT PLACEMENT Bilateral 06/19/2016   Procedure: CYSTOSCOPY WITH STENT REPLACEMENT;  Surgeon: Raymond LaserBudzyn,  James, MD;  Location: [facility];  Service: Urology;  Laterality: Bilateral;  . CYSTOSCOPY WITH STENT PLACEMENT Bilateral 05/21/2016   Procedure: CYSTOSCOPY WITH bilateral retrorade pylogram, left ureteral stent placement, attempted inertion of right ureteral stent;  Surgeon: Raymond LaserBudzyn,  James, MD;  Location: [facility];  Service: Urology;  Laterality: Bilateral;  . GREEN LIGHT Edwards TURP (TRANSURETHRAL RESECTION OF PROSTATE  2013  . HAMMER TOE SURGERY    . IR URETERAL STENT RIGHT NEW ACCESS W/SEP NEPHROSTOMY  CATH  05/29/2016  . KNEE DEBRIDEMENT Right   . LUMBAR LAMINECTOMY    . SEPTOPLASTY    . URETEROSCOPY WITH HOLMIUM Edwards LITHOTRIPSY Bilateral 06/19/2016   Procedure: URETEROSCOPY WITH HOLMIUM Edwards LITHOTRIPSY;  Surgeon: Raymond LaserBudzyn,  James, MD;  Location: [facility];  Service: Urology;  Laterality: Bilateral;    Home Medications:  Allergies as of 07/30/2016   No Known Allergies     Medication List       Accurate as of 07/30/16 11:04 AM. Always use your most recent med list.          aspirin EC 81 MG tablet Take 81 mg by mouth daily.   Cinnamon 500 MG capsule Take 2,000 mg by mouth daily.   doxepin 25 MG capsule Commonly known as:  SINEQUAN Take 25-50 mg by mouth at bedtime as needed (itching).   fluticasone 50 MCG/ACT nasal spray Commonly known as:  FLONASE PLACE 2 SPRAYS INTO BOTH NOSTRILS ONCE DAILY AS NEEDED.   hydrochlorothiazide 12.5 MG tablet Commonly known as:  HYDRODIURIL Take 6.25 mg by mouth at bedtime.   losartan 100 MG tablet Commonly known as:  COZAAR Take 100 mg by mouth daily.   Omega-3 1000 MG Caps Take 1 capsule by mouth daily.   omeprazole 20 MG capsule Commonly known as:  PRILOSEC TAKE ONE CAPSULE BY MOUTH EVERY MORNING   oxybutynin 5 MG tablet Commonly known as:  DITROPAN Take 1 tablet (5 mg total) by mouth every 8 (eight) hours as needed for bladder spasms.   oxyCODONE-acetaminophen 5-325 MG tablet Commonly known as:  ROXICET Take 1 tablet by mouth every 4 (  four) hours as needed.   sertraline 50 MG tablet Commonly known as:  ZOLOFT TAKE 1 TABLET BY MOUTH EVERY DAY       Allergies: No Known Allergies  Family History: Family History  Problem Relation Age of Onset  . Heart disease Father   . Heart disease Mother   . Prostate cancer Neg Hx   . Bladder Cancer Neg Hx   . Kidney cancer Neg Hx     Social History:  reports that he has never smoked. He quit smokeless tobacco use about 23 years ago. His smokeless tobacco use included  Chew. He reports that he drinks about 4.2 oz of alcohol per week . He reports that he does not use drugs.  ROS: UROLOGY Frequent Urination?: No Hard to postpone urination?: No Burning/pain with urination?: No Get up at night to urinate?: No Leakage of urine?: No Urine stream starts and stops?: No Trouble starting stream?: No Do you have to strain to urinate?: No Blood in urine?: No Urinary tract infection?: No Sexually transmitted disease?: No Injury to kidneys or bladder?: No Painful intercourse?: No Weak stream?: No Erection problems?: No Penile pain?: No  Gastrointestinal Nausea?: No Vomiting?: No Indigestion/heartburn?: No Diarrhea?: No Constipation?: No  Constitutional Fever: No Night sweats?: No Weight loss?: No Fatigue?: No  Skin Skin rash/lesions?: No Itching?: No  Eyes Blurred vision?: No Double vision?: No  Ears/Nose/Throat Sore throat?: No Sinus problems?: No  Hematologic/Lymphatic Swollen glands?: No Easy bruising?: No  Cardiovascular Leg swelling?: No Chest pain?: No  Respiratory Cough?: No Shortness of breath?: No  Endocrine Excessive thirst?: No  Musculoskeletal Back pain?: No Joint pain?: No  Neurological Headaches?: No Dizziness?: No  Psychologic Depression?: No Anxiety?: No  Physical Exam: BP 138/78 (BP Location: Left Arm, Patient Position: Sitting, Cuff Size: Normal)   Pulse 67   Ht 5\' 9"  (1.753 m)   Wt 225 lb 11.2 oz (102.4 kg)   BMI 33.33 kg/m   Constitutional:  Alert and oriented, No acute distress. HEENT: [place] AT, moist mucus membranes.  Trachea midline, no masses. Cardiovascular: No clubbing, cyanosis, or edema. Respiratory: Normal respiratory effort, no increased work of breathing. GI: Abdomen is soft, nontender, nondistended, no abdominal masses GU: No CVA tenderness.  Skin: No rashes, bruises or suspicious lesions. Lymph: No cervical or inguinal adenopathy. Neurologic: Grossly intact, no focal deficits,  moving all 4 extremities. Psychiatric: Normal mood and affect.  Laboratory Data: Lab Results  Component Value Date   WBC 9.1 05/29/2016   HGB 13.8 05/29/2016   HCT 40.7 05/29/2016   MCV 90.7 05/29/2016   PLT 332 05/29/2016    Lab Results  Component Value Date   CREATININE 1.94 (H) 05/29/2016    No results found for: PSA  No results found for: TESTOSTERONE  No results found for: HGBA1C  Urinalysis    Component Value Date/Time   APPEARANCEUR Cloudy (A) 07/03/2016 1546   GLUCOSEU Negative 07/03/2016 1546   BILIRUBINUR Negative 07/03/2016 1546   PROTEINUR 3+ (A) 07/03/2016 1546   NITRITE Negative 07/03/2016 1546   LEUKOCYTESUR Trace (A) 07/03/2016 1546    Pertinent Imaging: Renal ultrasound images reviewed as above.  Assessment & Plan:    1. History of bilateral nephrolithiasis I went over the patient's imaging findings and stone report. We discussed the ABCs of stone formation of a clip. We discussed ways to prevent future stone formation. We did briefly discuss 24-hour urine study but since the patient has not had any stones in almost 25  years prior to this he is not interested in proceeding. He will follow-up with [place] as needed.  No Follow-up on file.  Raymond Laser, MD  [facility] [address] [place], [place] [address] [phone]

## 2018-06-08 IMAGING — US US RENAL
1 series · 14 of 25 positions shown · non-contrast
Comparison: 05/19/2016 CT abdomen and pelvis.

CLINICAL DATA: 71 y/o  M; follow-up of urinary stone.

EXAM:
RENAL / URINARY TRACT ULTRASOUND COMPLETE

[Series 1: us renal · 0.26mm/px · 14 of 41 slices shown]
[im 1/41]
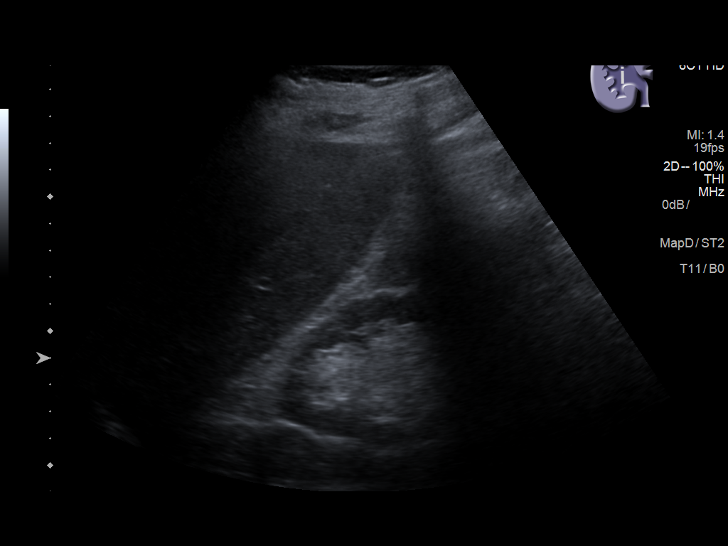
[im 4/41]
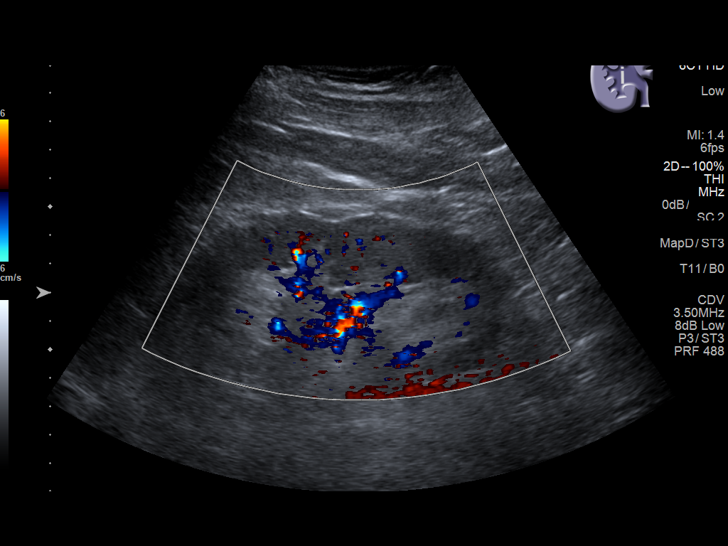
[im 7/41]
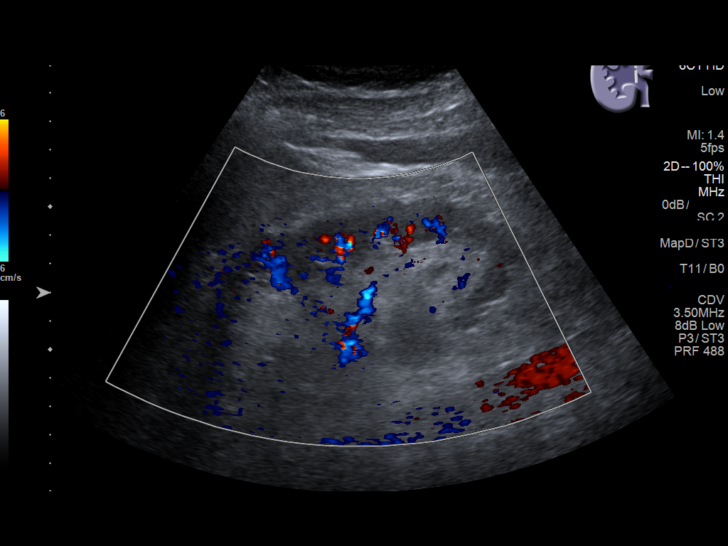
[im 11/41]
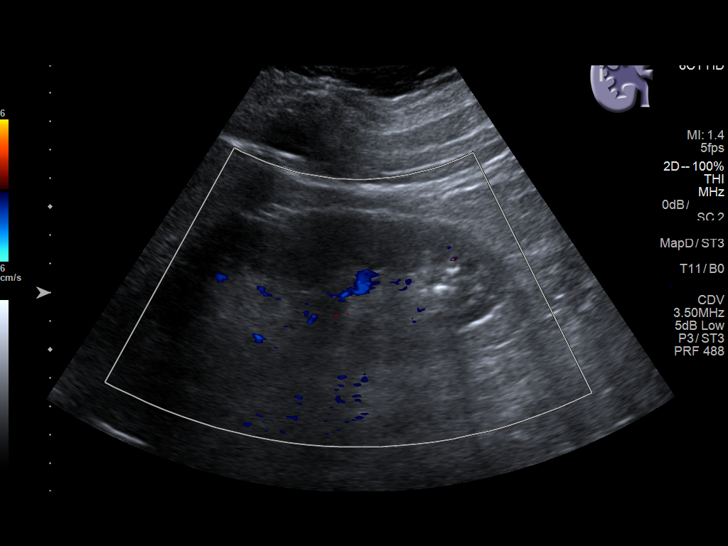
[im 14/41]
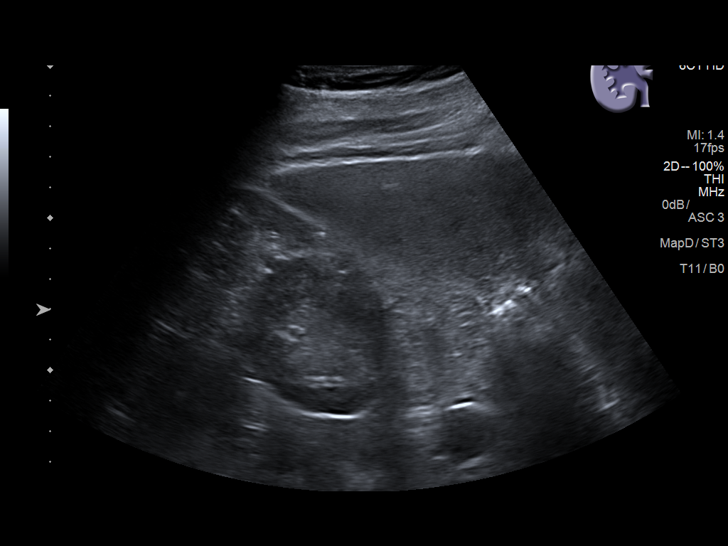
[im 16/41]
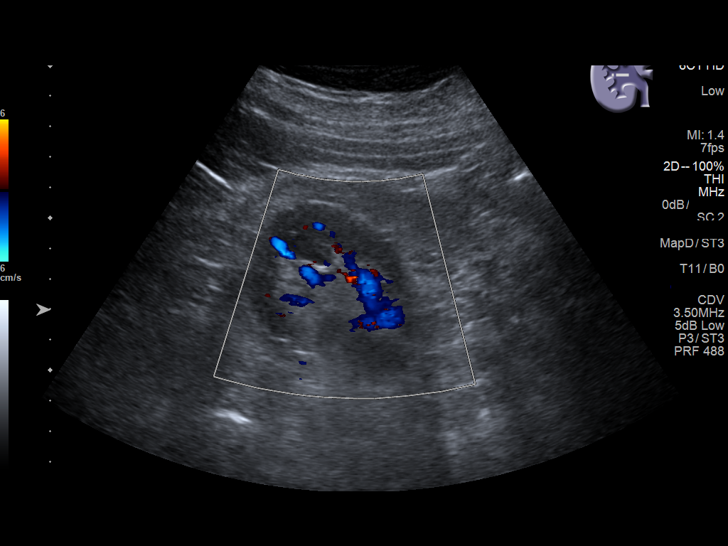
[im 19/41]
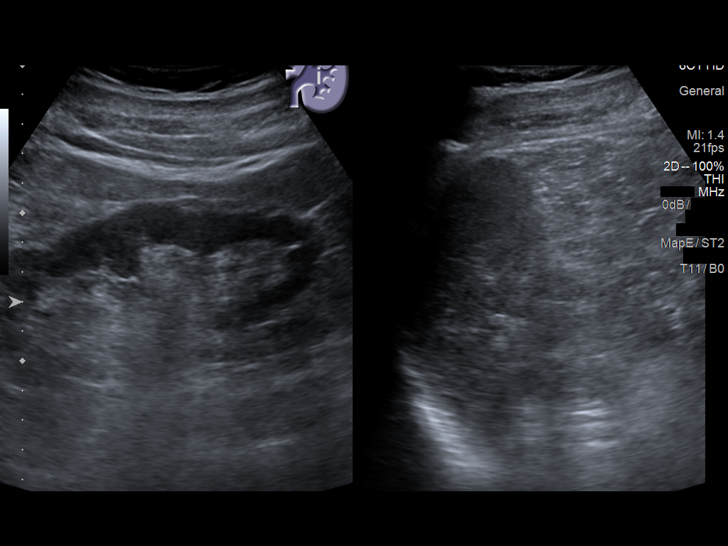
[im 22/41]
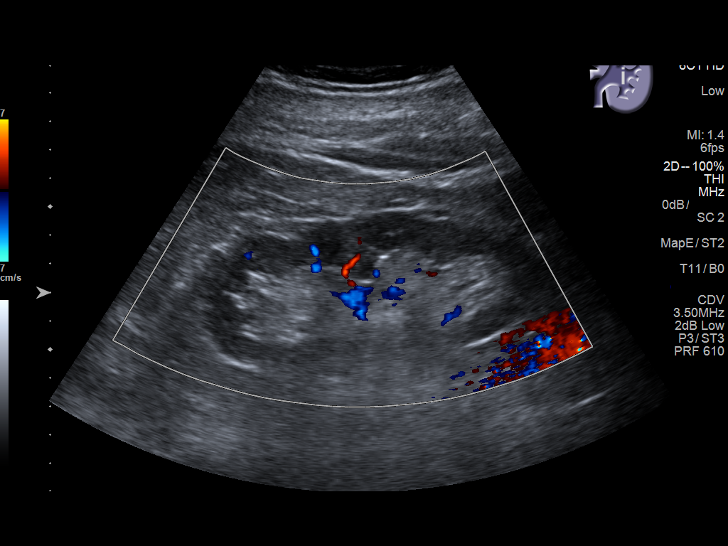
[im 26/41]
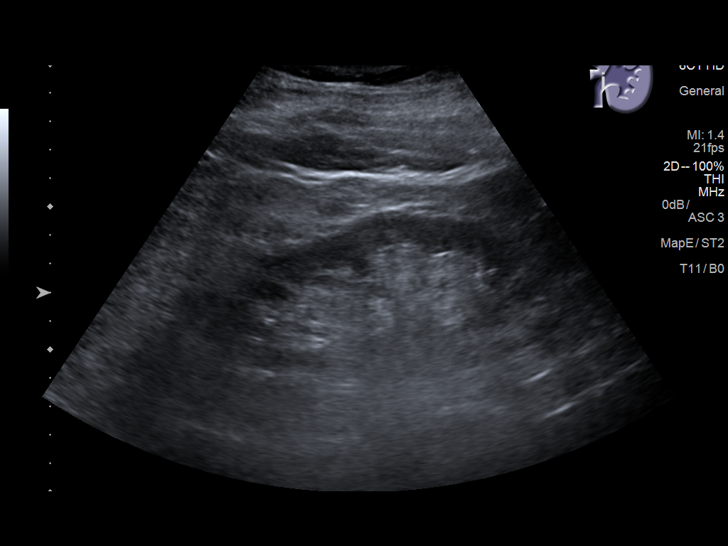
[im 27/41]
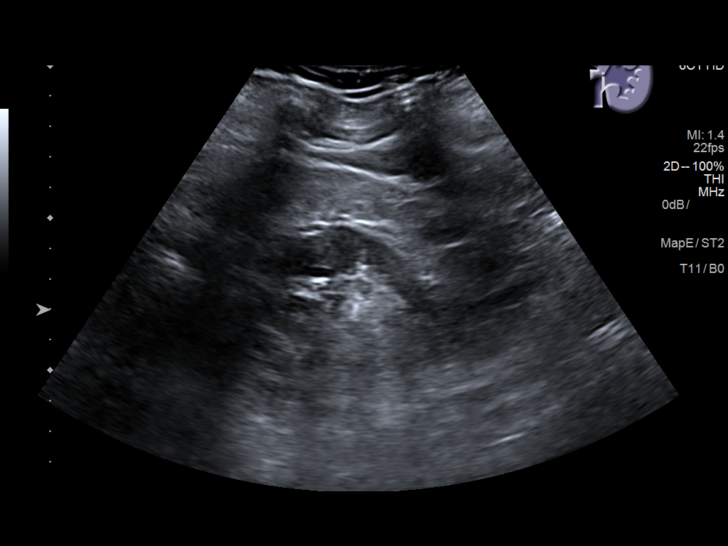
[im 31/41]
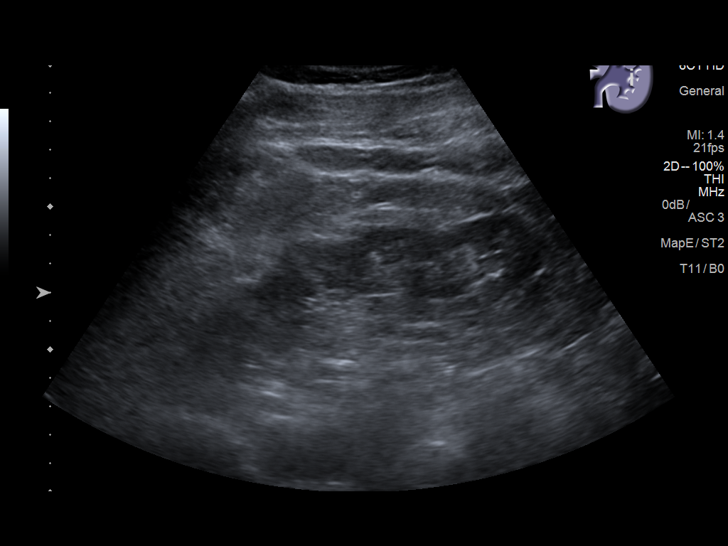
[im 34/41]
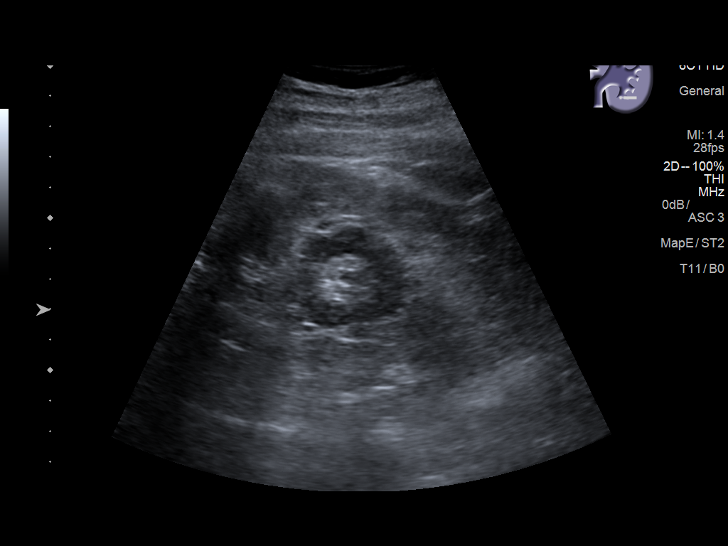
[im 37/41]
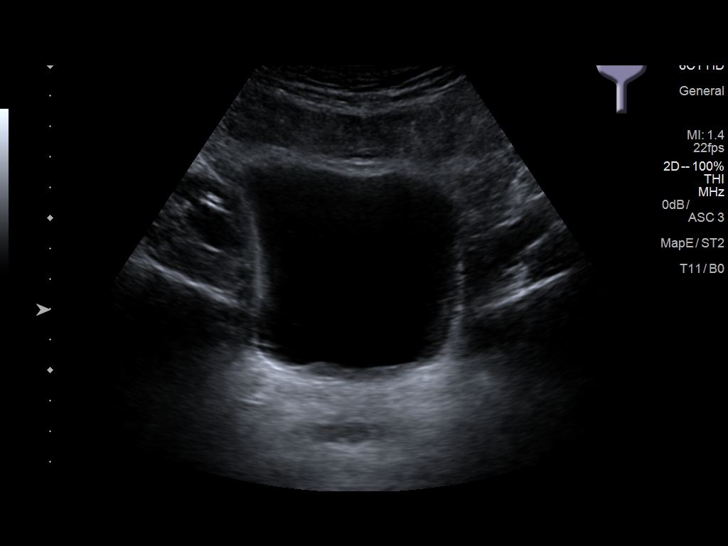
[im 41/41]
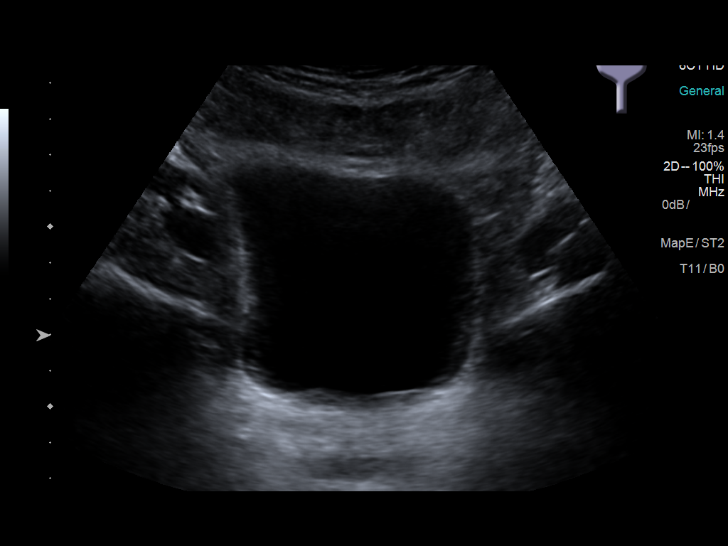

[14 of 25 positions shown; findings below may reference images not displayed]

FINDINGS: Right Kidney:

Length: 12.0 cm. No hydronephrosis. Lower pole 5 mm shadowing
echogenic focus compatible with a stone.

Left Kidney:

Length: 12.7 cm. No hydronephrosis. Midpole 6 mm shadowing echogenic
focus compatible with a stone.

Bladder:

Appears normal for degree of bladder distention. Bilateral ureteral
jets noted.
IMPRESSION: 1. No hydronephrosis.  Bilateral ureteral jets noted.
2. Bilateral nephrolithiasis.

By: Kasper Benny M.D.

## 2019-04-12 IMAGING — CT CT ABD-PEL WO/W CM
3 of 12 series · 12 of 46 positions shown, 18 images · IV contrast (iopamidol)
Comparison: CT scan 01/12/2009

CLINICAL DATA: Microhematuria.

EXAM:
CT ABDOMEN AND PELVIS WITHOUT AND WITH CONTRAST
TECHNIQUE: Multidetector CT imaging of the abdomen and pelvis was performed
following the standard protocol before and following the bolus
administration of intravenous contrast.
CONTRAST:  125mL JGA9IG-KLL IOPAMIDOL (JGA9IG-KLL) INJECTION 61%

[Series 5: hematuria < 45 with 100s · axial · 0.69mm/px · z∈[-908,-544]mm · 6 of 103 slices shown, 11 images]
[im 15/103  soft-tissue]
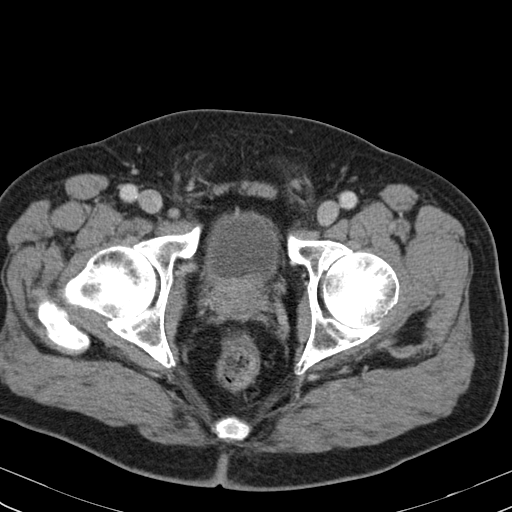
[im 15/103  bone]
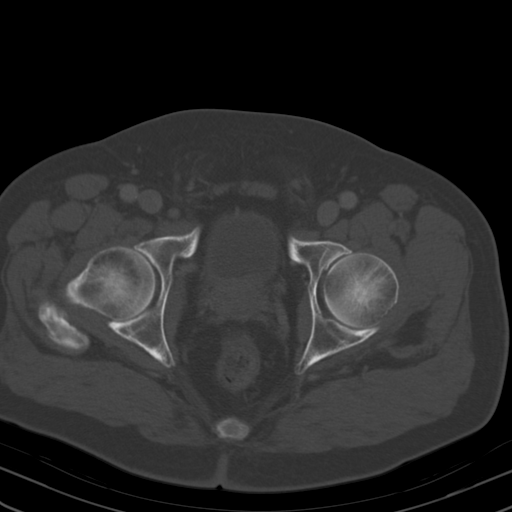
[im 30/103  soft-tissue]
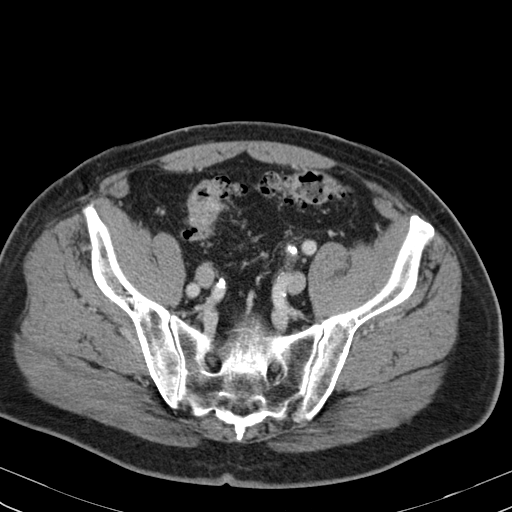
[im 44/103  soft-tissue]
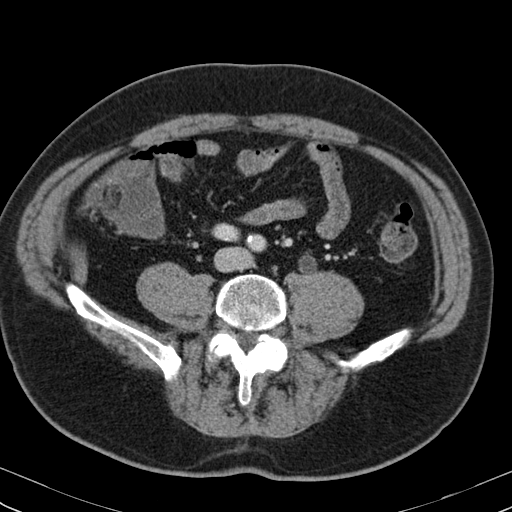
[im 44/103  lung]
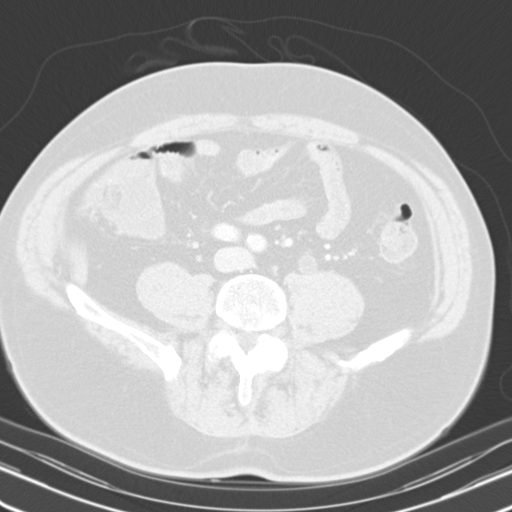
[im 59/103  soft-tissue]
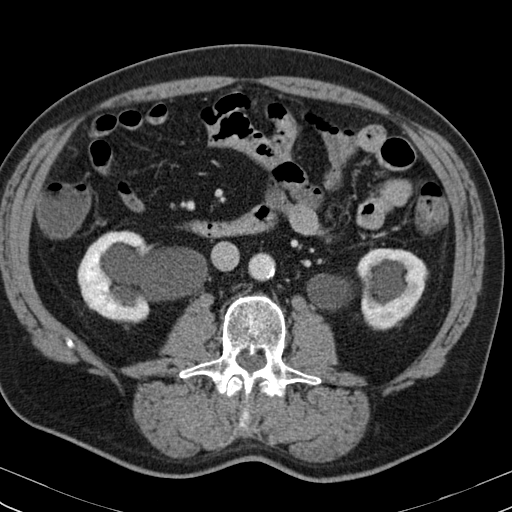
[im 59/103  lung]
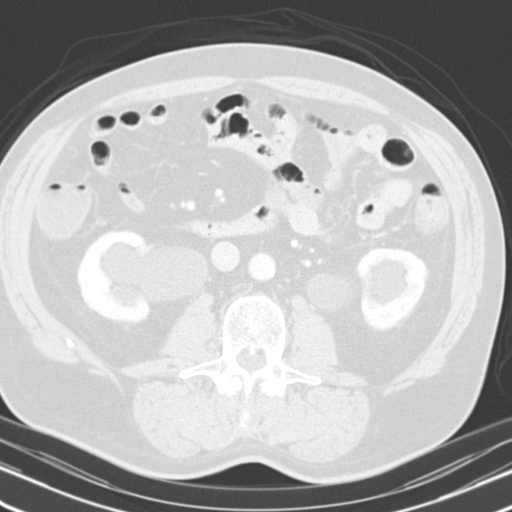
[im 73/103  soft-tissue]
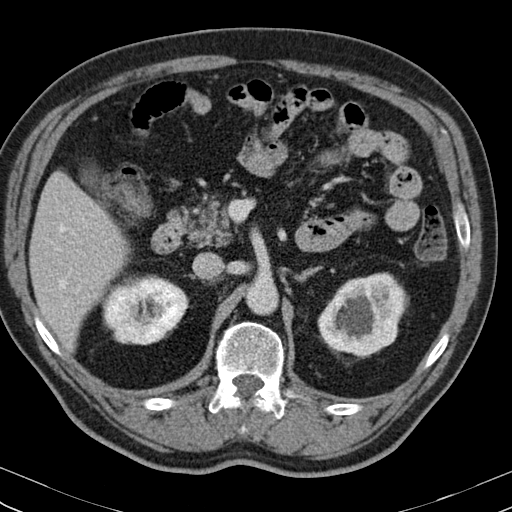
[im 73/103  lung]
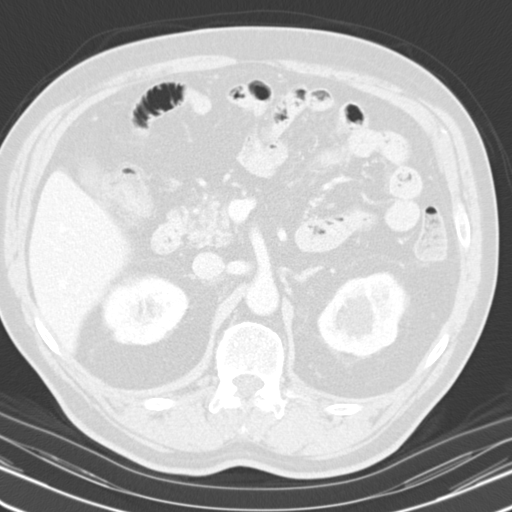
[im 88/103  soft-tissue]
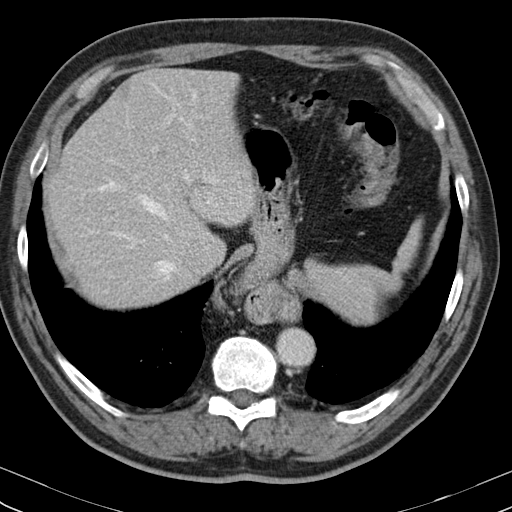
[im 88/103  lung]
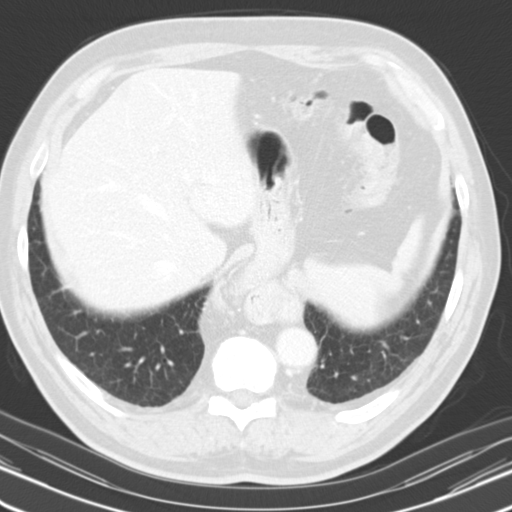

[Series 9: hematuria > 45 10 min delay · axial · delayed · 0.69mm/px · z∈[-874,-659]mm · 4 of 102 slices shown]
[im 15/102  soft-tissue]
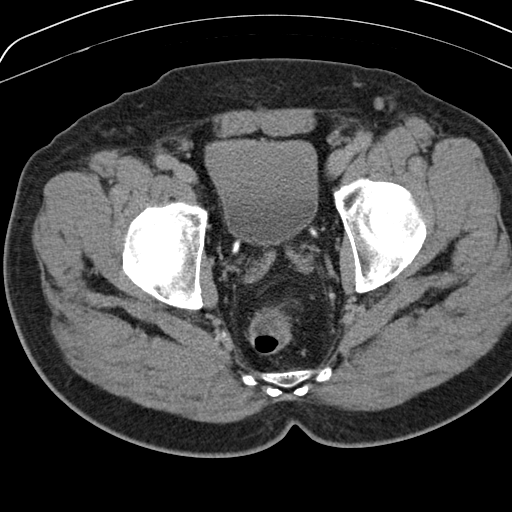
[im 29/102  soft-tissue]
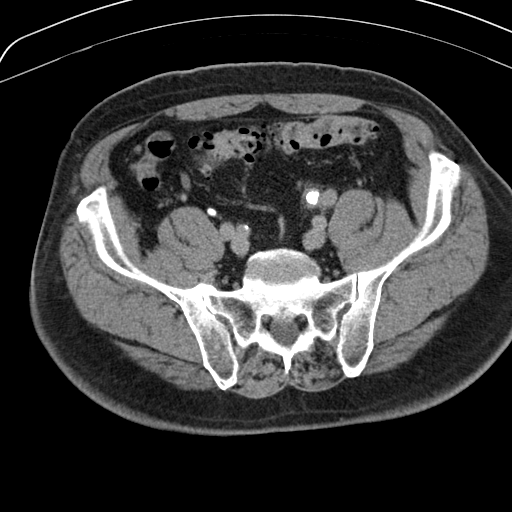
[im 44/102  soft-tissue]
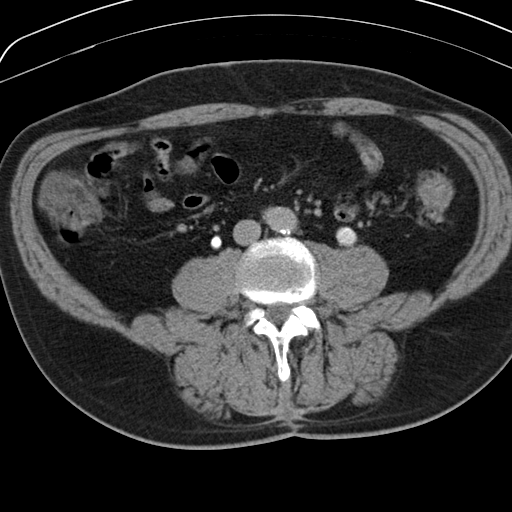
[im 58/102  soft-tissue]
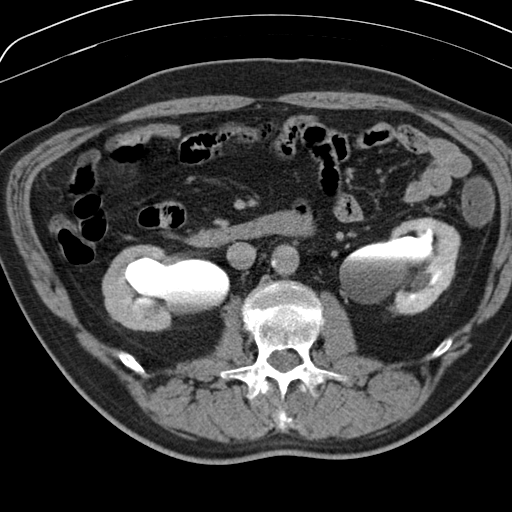

[Series 602: coronal wo · coronal · 0.95mm/px · 2 of 141 slices shown, 3 images]
[im 47/141  soft-tissue]
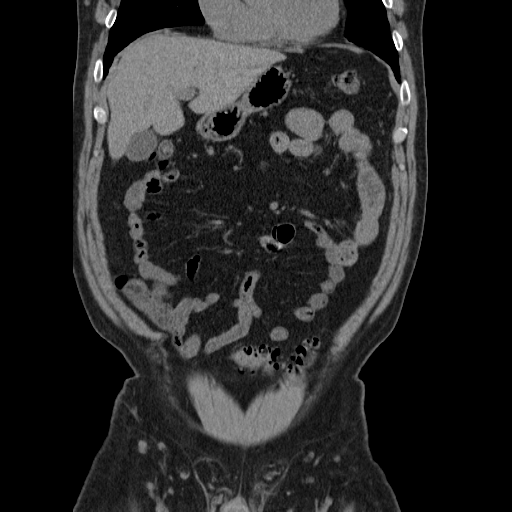
[im 47/141  bone]
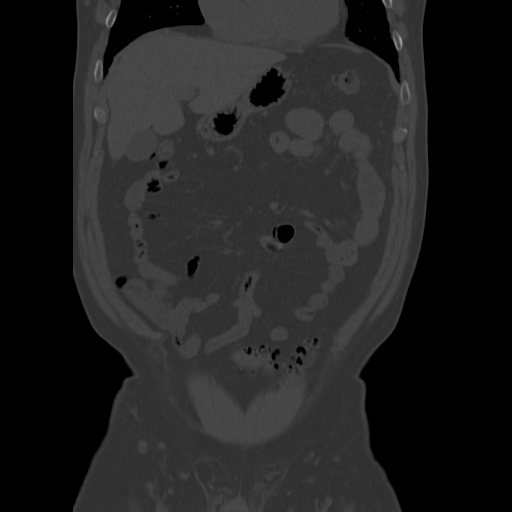
[im 94/141  soft-tissue]
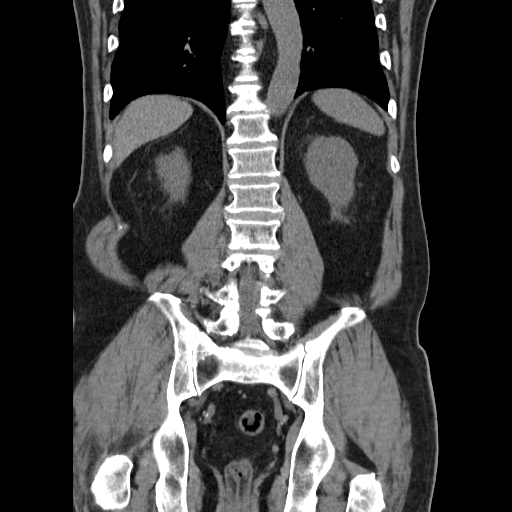

[12 of 46 positions shown; findings below may reference images not displayed]

FINDINGS: Lower chest: The lung bases are clear of acute process. No worrisome
pulmonary lesions. The heart is normal in size. No pericardial
effusion. Large hiatal hernia.

Hepatobiliary: Small calcified granuloma in the right hepatic lobe
but no worrisome hepatic lesions or intrahepatic biliary dilatation.
The portal and hepatic veins are patent. The gallbladder is grossly
normal. No common bile duct dilatation.

Pancreas: No mass, inflammation or ductal dilatation.

Spleen: Normal size.  No focal lesions.

Adrenals/Urinary Tract: The adrenal glands are normal.

Significant bilateral hydronephrosis. Small bilateral renal calculi.

There is a large obstructing UPJ calculus on the right side
measuring 16.5 x 11.5 mm. The mid distal right ureter is normal in
caliber. No other calculi are identified.

There are multiple left ureteral calculi. The largest calculus,
causing the obstruction, is near the pelvic inlet and measures 15 x
9 mm. No distal ureteral calculi and no bladder calculi.

Both kidneys demonstrate symmetric enhancement. Small renal cysts
but no worrisome renal lesions. The delayed images demonstrate some
contrast getting into the distal ureters and bladder. The
obstruction on the left side is more high-grade. No obvious
collecting system abnormalities. No obvious bladder lesions.

Stomach/Bowel: The stomach, duodenum, small bowel and colon are
grossly normal without oral contrast. No inflammatory changes, mass
lesions or obstructive findings. The terminal ileum and appendix are
normal. Advanced descending and sigmoid diverticulosis without
findings for acute diverticulitis.

Vascular/Lymphatic: Scattered atherosclerotic calcifications
involving the aorta iliac arteries. No aneurysm. No mesenteric or
retroperitoneal mass or adenopathy.

Reproductive: The prostate gland and seminal vesicles are grossly
normal.

Other: No pelvic mass or adenopathy. No free pelvic fluid
collections. No inguinal mass or adenopathy. No abdominal wall
hernia or subcutaneous lesions.

Musculoskeletal: No significant bony findings.
IMPRESSION: 1. Significant bilateral hydronephrosis due to obstructing bilateral
ureteral calculi as described above.
2. No worrisome renal or bladder lesions.
3. Moderate to large hiatal hernia.
4. Descending and sigmoid diverticulosis without findings for acute
diverticulitis.

## 2021-08-19 ENCOUNTER — Telehealth: Payer: Self-pay

## 2021-08-19 ENCOUNTER — Ambulatory Visit (INDEPENDENT_AMBULATORY_CARE_PROVIDER_SITE_OTHER): Payer: Medicare Other | Admitting: Urology

## 2021-08-19 ENCOUNTER — Encounter: Payer: Self-pay | Admitting: Urology

## 2021-08-19 ENCOUNTER — Other Ambulatory Visit
Admission: RE | Admit: 2021-08-19 | Discharge: 2021-08-19 | Disposition: A | Discharge status: Home / Self Care | Payer: Medicare Other | Attending: Urology | Admitting: Urology

## 2021-08-19 ENCOUNTER — Other Ambulatory Visit: Payer: Self-pay

## 2021-08-19 VITALS — BP 145/79 | HR 65 | Ht 71.0 in | Wt 215.0 lb

## 2021-08-19 DIAGNOSIS — R300 Dysuria: Secondary | ICD-10-CM

## 2021-08-19 DIAGNOSIS — N390 Urinary tract infection, site not specified: Secondary | ICD-10-CM

## 2021-08-19 DIAGNOSIS — R1032 Left lower quadrant pain: Secondary | ICD-10-CM | POA: Diagnosis not present

## 2021-08-19 DIAGNOSIS — R310 Gross hematuria: Secondary | ICD-10-CM

## 2021-08-19 LAB — URINALYSIS, COMPLETE (UACMP) WITH MICROSCOPIC
Bilirubin Urine: NEGATIVE
Glucose, UA: NEGATIVE mg/dL
Ketones, ur: NEGATIVE mg/dL
Leukocytes,Ua: NEGATIVE
Nitrite: NEGATIVE
Protein, ur: NEGATIVE mg/dL
Specific Gravity, Urine: 1.01 (ref 1.005–1.030)
pH: 6 (ref 5.0–8.0)

## 2021-08-19 LAB — BLADDER SCAN AMB NON-IMAGING

## 2021-08-19 NOTE — Progress Notes (Signed)
08/19/21 3:55 PM   Raymond Edwards. 11-09-1945 [id number]  CC: Gross hematuria  HPI: 76 year old male who presents with 1 month of intermittent gross hematuria, as well as 1 episode of blood clots.  He has had some intermittent left-sided groin pain as well that he is unsure if this is related.  He denies any dysuria or problems urinating.  He has extensive urologic history including a greenlight laser PVP with Dr. Sheppard Penton in 2013, as well as bilateral ureteroscopy, laser lithotripsy, and stent placement in June 2018 with Dr. Sherryl Barters for bilateral large ureteral stones.  Stents were removed and follow-up renal ultrasound showed no hydronephrosis.  He denies any urinary symptoms.  Urinalysis today benign   PMH: Past Medical History:  Diagnosis Date   Arthritis    Depression    Environmental and seasonal allergies    GERD (gastroesophageal reflux disease)    History of kidney stones    Hypertension    Kidney stones     Surgical History: Past Surgical History:  Procedure Laterality Date   BACK SURGERY     CYSTOSCOPY W/ URETERAL STENT PLACEMENT Bilateral 06/19/2016   Procedure: CYSTOSCOPY WITH STENT REPLACEMENT;  Surgeon: Hildred Laser, MD;  Location: [facility];  Service: Urology;  Laterality: Bilateral;   CYSTOSCOPY WITH STENT PLACEMENT Bilateral 05/21/2016   Procedure: CYSTOSCOPY WITH bilateral retrorade pylogram, left ureteral stent placement, attempted inertion of right ureteral stent;  Surgeon: Hildred Laser, MD;  Location: [facility];  Service: Urology;  Laterality: Bilateral;   GREEN LIGHT LASER TURP (TRANSURETHRAL RESECTION OF PROSTATE  2013   HAMMER TOE SURGERY     IR URETERAL STENT RIGHT NEW ACCESS W/SEP NEPHROSTOMY CATH  05/29/2016   KNEE DEBRIDEMENT Right    LUMBAR LAMINECTOMY     SEPTOPLASTY     URETEROSCOPY WITH HOLMIUM LASER LITHOTRIPSY Bilateral 06/19/2016   Procedure: URETEROSCOPY WITH HOLMIUM LASER LITHOTRIPSY;  Surgeon: Hildred Laser, MD;  Location:  [facility];  Service: Urology;  Laterality: Bilateral;    Family History: Family History  Problem Relation Age of Onset   Heart disease Father    Heart disease Mother    Prostate cancer Neg Hx    Bladder Cancer Neg Hx    Kidney cancer Neg Hx     Social History:  reports that he has never smoked. He has never been exposed to tobacco smoke. He quit smokeless tobacco use about 28 years ago.  His smokeless tobacco use included chew. He reports current alcohol use of about 7.0 standard drinks of alcohol per week. He reports that he does not use drugs.  Physical Exam: BP (!) 145/79   Pulse 65   Ht 5\' 11"  (1.803 m)   Wt 215 lb (97.5 kg)   BMI 29.99 kg/m    Constitutional:  Alert and oriented, No acute distress. Cardiovascular: No clubbing, cyanosis, or edema. Respiratory: Normal respiratory effort, no increased work of breathing. GI: Abdomen is soft, nontender, nondistended, no abdominal masses   Pertinent Imaging: I have personally viewed and interpreted the prior CT from 2018 showing significant bilateral ureteral stone burden.  Assessment & Plan:   75 year old male with gross hematuria over the last month as well as some intermittent left lower quadrant pain of unclear etiology.  Extensive prior urologic history including greenlight laser PVP in 2013 by Dr. 2014, and bilateral ureteroscopy for large ureteral stones with Dr. Sheppard Penton in 2018.  We discussed common possible etiologies of hematuria including BPH, malignancy, urolithiasis, medical renal  disease, and idiopathic. Standard workup recommended by the [facility] includes imaging with CT urogram to assess the upper tracts, and cystoscopy. Cytology is performed on patient's with gross hematuria to look for malignant cells in the urine.  CT and cystoscopy for gross hematuria work-up  Legrand Rams, MD 08/19/2021  [facility] [address], [address] [place], [place] [address] [phone]

## 2021-08-19 NOTE — Telephone Encounter (Signed)
Called pt informed him of the information below. Pt voiced understanding.  

## 2021-08-19 NOTE — Telephone Encounter (Signed)
-----   Message from Sondra Come, MD sent at 08/19/2021  4:13 PM EDT ----- No infection on urinalysis, keep follow-up for CT and cystoscopy  Legrand Rams, MD 08/19/2021

## 2021-08-19 NOTE — Patient Instructions (Signed)

## 2021-08-27 ENCOUNTER — Ambulatory Visit
Admission: RE | Admit: 2021-08-27 | Discharge: 2021-08-27 | Disposition: A | Discharge status: Home / Self Care | Payer: Medicare Other | Source: Ambulatory Visit | Attending: Urology | Admitting: Urology

## 2021-08-27 DIAGNOSIS — R310 Gross hematuria: Secondary | ICD-10-CM | POA: Insufficient documentation

## 2021-08-27 LAB — POCT I-STAT CREATININE: Creatinine, Ser: 1.2 mg/dL (ref 0.61–1.24)

## 2021-08-27 MED ORDER — IOHEXOL 300 MG/ML  SOLN
125.0000 mL | Freq: Once | INTRAMUSCULAR | Status: AC | PRN
  Administered 2021-08-27: 125 mL via INTRAVENOUS

## 2021-09-09 ENCOUNTER — Other Ambulatory Visit: Payer: Self-pay | Admitting: *Deleted

## 2021-09-09 ENCOUNTER — Other Ambulatory Visit
Admission: RE | Admit: 2021-09-09 | Discharge: 2021-09-09 | Disposition: A | Discharge status: Home / Self Care | Payer: Medicare Other | Attending: Urology | Admitting: Urology

## 2021-09-09 ENCOUNTER — Encounter: Payer: Self-pay | Admitting: Urology

## 2021-09-09 ENCOUNTER — Ambulatory Visit: Payer: Medicare Other | Admitting: Urology

## 2021-09-09 VITALS — BP 164/86 | HR 63 | Ht 71.0 in | Wt 215.0 lb

## 2021-09-09 DIAGNOSIS — R310 Gross hematuria: Secondary | ICD-10-CM

## 2021-09-09 DIAGNOSIS — R300 Dysuria: Secondary | ICD-10-CM

## 2021-09-09 LAB — URINALYSIS, COMPLETE (UACMP) WITH MICROSCOPIC
Bilirubin Urine: NEGATIVE
Glucose, UA: NEGATIVE mg/dL
Ketones, ur: NEGATIVE mg/dL
Leukocytes,Ua: NEGATIVE
Nitrite: NEGATIVE
Protein, ur: NEGATIVE mg/dL
Specific Gravity, Urine: 1.015 (ref 1.005–1.030)
pH: 6 (ref 5.0–8.0)

## 2021-09-09 MED ORDER — LIDOCAINE HCL URETHRAL/MUCOSAL 2 % EX GEL
1.0000 | Freq: Once | CUTANEOUS | Status: AC
  Administered 2021-09-09: 1 via URETHRAL

## 2021-09-09 NOTE — Progress Notes (Signed)
Cystoscopy Procedure Note:  Indication: Gross hematuria  After informed consent and discussion of the procedure and its risks, Raymond Edwards. was positioned and prepped in the standard fashion. Cystoscopy was performed with a flexible cystoscope. The urethra, bladder neck and entire bladder was visualized in a standard fashion. The prostate was moderate in size with open channel from prior PVP, mild regrowth of adenoma with some friable vessels. The ureteral orifices were visualized in their normal location and orientation.  Bladder mucosa grossly normal throughout, no abnormalities on retroflexion.  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  He opts for observation at this time, low threshold to start finasteride if recurrent/persistent gross hematuria or clots  Call with cytology results RTC 6 months PVR  Legrand Rams, MD 09/09/2021

## 2021-09-11 LAB — SURGICAL PATHOLOGY

## 2022-03-17 ENCOUNTER — Ambulatory Visit: Payer: Medicare Other | Admitting: Urology

## 2022-04-03 ENCOUNTER — Encounter (INDEPENDENT_AMBULATORY_CARE_PROVIDER_SITE_OTHER): Payer: Self-pay | Admitting: Vascular Surgery

## 2022-04-03 ENCOUNTER — Encounter (INDEPENDENT_AMBULATORY_CARE_PROVIDER_SITE_OTHER): Payer: Self-pay

## 2022-04-14 ENCOUNTER — Other Ambulatory Visit (INDEPENDENT_AMBULATORY_CARE_PROVIDER_SITE_OTHER): Payer: Self-pay | Admitting: Nurse Practitioner

## 2022-04-14 DIAGNOSIS — I739 Peripheral vascular disease, unspecified: Secondary | ICD-10-CM

## 2022-04-24 ENCOUNTER — Encounter (INDEPENDENT_AMBULATORY_CARE_PROVIDER_SITE_OTHER): Payer: Self-pay | Admitting: Vascular Surgery

## 2022-04-24 ENCOUNTER — Ambulatory Visit (INDEPENDENT_AMBULATORY_CARE_PROVIDER_SITE_OTHER): Payer: Medicare Other | Admitting: Vascular Surgery

## 2022-04-24 ENCOUNTER — Ambulatory Visit (INDEPENDENT_AMBULATORY_CARE_PROVIDER_SITE_OTHER): Payer: Medicare Other

## 2022-04-24 VITALS — BP 154/91 | HR 60 | Resp 18 | Ht 70.5 in | Wt 216.0 lb

## 2022-04-24 DIAGNOSIS — I739 Peripheral vascular disease, unspecified: Secondary | ICD-10-CM | POA: Diagnosis not present

## 2022-04-24 DIAGNOSIS — I10 Essential (primary) hypertension: Secondary | ICD-10-CM | POA: Insufficient documentation

## 2022-04-24 DIAGNOSIS — M7661 Achilles tendinitis, right leg: Secondary | ICD-10-CM | POA: Diagnosis not present

## 2022-04-24 NOTE — Assessment & Plan Note (Signed)
blood pressure control important in reducing the progression of atherosclerotic disease. On appropriate oral medications.  

## 2022-04-24 NOTE — Progress Notes (Signed)
Patient ID: Raymond Edwards., male   DOB: Jan 03, 1946, 77 y.o.   MRN: [id number]  Chief Complaint  Patient presents with   New Patient (Initial Visit)    NP. baker. abi/consult. pvd    HPI Raymond Edwards. is a 77 y.o. male.  I am asked to see the patient by Dr. Excell Seltzer for evaluation of lower extremity perfusion.  He has significant right Achilles tendinitis and may ultimately require surgery for this.  He has multiple medical comorbidities with longstanding hypertension and hyperlipidemia.  Since he may very well require surgical therapy, he is appropriately referred for evaluation of his lower extremity arterial perfusion.  He has a lot of aching in his legs but not true claudication symptoms.  No history of breast pain.  ABIs today are 1.12 on the right and 1.16 on the left with triphasic waveforms and normal digital pressures..     Past Medical History:  Diagnosis Date   Arthritis    Depression    Environmental and seasonal allergies    GERD (gastroesophageal reflux disease)    History of kidney stones    Hypertension    Kidney stones     Past Surgical History:  Procedure Laterality Date   BACK SURGERY     CYSTOSCOPY W/ URETERAL STENT PLACEMENT Bilateral 06/19/2016   Procedure: CYSTOSCOPY WITH STENT REPLACEMENT;  Surgeon: Hildred Laser, MD;  Location: [facility];  Service: Urology;  Laterality: Bilateral;   CYSTOSCOPY WITH STENT PLACEMENT Bilateral 05/21/2016   Procedure: CYSTOSCOPY WITH bilateral retrorade pylogram, left ureteral stent placement, attempted inertion of right ureteral stent;  Surgeon: Hildred Laser, MD;  Location: [facility];  Service: Urology;  Laterality: Bilateral;   GREEN LIGHT LASER TURP (TRANSURETHRAL RESECTION OF PROSTATE  2013   HAMMER TOE SURGERY     IR URETERAL STENT RIGHT NEW ACCESS W/SEP NEPHROSTOMY CATH  05/29/2016   KNEE DEBRIDEMENT Right    LUMBAR LAMINECTOMY     SEPTOPLASTY     URETEROSCOPY WITH HOLMIUM LASER LITHOTRIPSY Bilateral  06/19/2016   Procedure: URETEROSCOPY WITH HOLMIUM LASER LITHOTRIPSY;  Surgeon: Hildred Laser, MD;  Location: [facility];  Service: Urology;  Laterality: Bilateral;     Family History  Problem Relation Age of Onset   Heart disease Father    Heart disease Mother    Prostate cancer Neg Hx    Bladder Cancer Neg Hx    Kidney cancer Neg Hx      Social History   Tobacco Use   Smoking status: Never    Passive exposure: Never   Smokeless tobacco: Former    Types: Chew    Quit date: 06/04/1993  Vaping Use   Vaping Use: Never used  Substance Use Topics   Alcohol use: Yes    Alcohol/week: 7.0 standard drinks of alcohol    Types: 7 Cans of beer per week   Drug use: No     No Known Allergies  Current Outpatient Medications  Medication Sig Dispense Refill   aspirin EC 81 MG tablet Take 81 mg by mouth daily.      clobetasol cream (TEMOVATE) 0.05 % Apply 1 Application topically 2 (two) times daily.     fluticasone (FLONASE) 50 MCG/ACT nasal spray PLACE 2 SPRAYS INTO BOTH NOSTRILS ONCE DAILY AS NEEDED.     hydrochlorothiazide (HYDRODIURIL) 12.5 MG tablet Take 6.25 mg by mouth at bedtime.      ibuprofen (ADVIL) 200 MG tablet Take by mouth.  losartan (COZAAR) 100 MG tablet Take 100 mg by mouth daily.     Omega-3 1000 MG CAPS Take 1 capsule by mouth daily.      omeprazole (PRILOSEC) 20 MG capsule TAKE ONE CAPSULE BY MOUTH EVERY MORNING     pravastatin (PRAVACHOL) 20 MG tablet Take 20 mg by mouth at bedtime.     sertraline (ZOLOFT) 50 MG tablet TAKE 1 TABLET BY MOUTH EVERY DAY     triamcinolone cream (KENALOG) 0.1 % Apply 1 Application topically 2 (two) times daily.     No current facility-administered medications for this visit.      REVIEW OF SYSTEMS (Negative unless checked)  Constitutional: Weight loss  Fever  Chills Cardiac: Chest pain   Chest pressure   Palpitations   Shortness of breath when laying flat   Shortness of breath at rest   Shortness of  breath with exertion. Vascular:  Pain in legs with walking   Pain in legs at rest   Pain in legs when laying flat   Claudication   Pain in feet when walking  Pain in feet at rest  Pain in feet when laying flat   History of DVT   Phlebitis   Swelling in legs   Varicose veins   Non-healing ulcers Pulmonary:   Uses home oxygen   Productive cough   Hemoptysis   Wheeze  COPD   Asthma Neurologic:  Dizziness  Blackouts   Seizures   History of stroke   History of TIA  Aphasia   Temporary blindness   Dysphagia   Weakness or numbness in arms   Weakness or numbness in legs Musculoskeletal:  Arthritis   Joint swelling   Joint pain   Low back pain Hematologic:  Easy bruising  Easy bleeding   Hypercoagulable state   Anemic  Hepatitis Gastrointestinal:  Blood in stool   Vomiting blood  Gastroesophageal reflux/heartburn   Abdominal pain Genitourinary:  Chronic kidney disease   Difficult urination  Frequent urination  Burning with urination   Hematuria Skin:  Rashes   Ulcers   Wounds Psychological:  History of anxiety    History of major depression.    Physical Exam BP (!) 154/91 (BP Location: Right Arm)   Pulse 60   Resp 18   Ht 5' 10.5" (1.791 m)   Wt 216 lb (98 kg)   BMI 30.55 kg/m  Gen:  WD/WN, NAD. Appears younger than stated age. Head: [place]/AT, No temporalis wasting. Prominent temp pulse not noted. Ear/Nose/Throat: Hearing grossly intact, nares w/o erythema or drainage, oropharynx w/o Erythema/Exudate Eyes: Conjunctiva clear, sclera non-icteric  Neck: trachea midline.  No JVD.  Pulmonary:  Good air movement, respirations not labored, no use of accessory muscles  Cardiac: RRR, no JVD Vascular:  Vessel Right Left  Radial Palpable Palpable                          DP 2+ 2+  PT 2+ 2+   Gastrointestinal:. No masses, surgical incisions, or scars. Musculoskeletal: M/S 5/5 throughout.   Extremities without ischemic changes.  No deformity or atrophy. No edema. Neurologic: Sensation grossly intact in extremities.  Symmetrical.  Speech is fluent. Motor exam as listed above. Psychiatric: Judgment intact, Mood & affect appropriate for pt's clinical situation. Dermatologic: No rashes or ulcers noted.  No cellulitis or open wounds.    Radiology No results found.  Labs No results found for this or any previous visit (from the past  2160 hour(s)).  Assessment/Plan:  Achilles tendinitis of right lower extremity ABIs today are 1.12 on the right and 1.16 on the left with triphasic waveforms and normal digital pressures.  He has adequate blood flow for wound healing and it does not appear as if arterial insufficiency is a cause or an issue with his symptoms.  No further vascular workup is planned at this time.  Follow-up as needed.  Hypertension blood pressure control important in reducing the progression of atherosclerotic disease. On appropriate oral medications.      Festus Barren 04/24/2022, 9:22 AM   This note was created with Dragon medical transcription system.  Any errors from dictation are unintentional.

## 2022-04-24 NOTE — Assessment & Plan Note (Signed)
ABIs today are 1.12 on the right and 1.16 on the left with triphasic waveforms and normal digital pressures.  He has adequate blood flow for wound healing and it does not appear as if arterial insufficiency is a cause or an issue with his symptoms.  No further vascular workup is planned at this time.  Follow-up as needed.

## 2022-09-08 ENCOUNTER — Ambulatory Visit: Payer: Medicare Other | Admitting: Urology

## 2022-09-08 ENCOUNTER — Encounter: Payer: Self-pay | Admitting: Urology

## 2022-09-08 VITALS — BP 151/79 | HR 61 | Ht 70.0 in | Wt 211.0 lb

## 2022-09-08 DIAGNOSIS — N401 Enlarged prostate with lower urinary tract symptoms: Secondary | ICD-10-CM

## 2022-09-08 DIAGNOSIS — R310 Gross hematuria: Secondary | ICD-10-CM

## 2022-09-08 DIAGNOSIS — N138 Other obstructive and reflux uropathy: Secondary | ICD-10-CM

## 2022-09-08 LAB — BLADDER SCAN AMB NON-IMAGING

## 2022-09-08 NOTE — Progress Notes (Signed)
   09/08/2022 10:28 AM   Raymond Edwards. 14-Jul-1945 [id number]  Reason for visit: Follow up gross hematuria, BPH  HPI: 77 year old male previously followed by Dr. Sheppard Penton and Dr. Sherryl Barters, he underwent a greenlight laser PVP with Dr. Sheppard Penton in 2013, as well as bilateral ureteroscopy for stone disease in June 2018 with Dr. Sherryl Barters.  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  He declined finasteride or repeat outlet procedure with HOLEP at that time and opted for observation alone.  He has done very well since then and denies any recurrent gross hematuria.  He denies any urinary symptoms, and PVR today is normal at 50ml.  We again reviewed treatment options if he has recurrent gross hematuria with either finasteride or HOLEP, and he would like to continue with observation alone.  Return precautions discussed at length.  RTC 1 year PVR and symptom check, sooner if problems.  If doing well at that time likely can RTC as needed  Sondra Come, MD  [facility] [address] [place], [place] [address] [phone]

## 2023-07-26 ENCOUNTER — Other Ambulatory Visit: Payer: Self-pay | Admitting: Physician Assistant

## 2023-07-26 DIAGNOSIS — I6522 Occlusion and stenosis of left carotid artery: Secondary | ICD-10-CM

## 2023-07-30 ENCOUNTER — Ambulatory Visit
Admission: RE | Admit: 2023-07-30 | Discharge: 2023-07-30 | Disposition: A | Discharge status: Home / Self Care | Source: Ambulatory Visit | Attending: Physician Assistant | Admitting: Physician Assistant

## 2023-07-30 DIAGNOSIS — I6522 Occlusion and stenosis of left carotid artery: Secondary | ICD-10-CM | POA: Insufficient documentation

## 2023-07-30 MED ORDER — IOHEXOL 350 MG/ML SOLN
75.0000 mL | Freq: Once | INTRAVENOUS | Status: AC | PRN
  Administered 2023-07-30: 75 mL via INTRAVENOUS

## 2023-08-25 NOTE — Progress Notes (Unsigned)
 MRN : [id number]  Raymond Edwards. is a 78 y.o. ([birth year]) male who presents with chief complaint of check carotid arteries.  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  CT angio of the head and neck was ordered and is reviewed by me.  It appears the CT was ordered for lightheadedness.  It demonstrates very minimal atherosclerotic changes of the carotid arteries both cervical and cerebral.  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  The right vertebral artery finding is not clinically significant.  The patient denies amaurosis fugax. There is no recent history of TIA symptoms or focal motor deficits. There is no prior documented CVA.  There is no history of migraine headaches. There is no history of seizures.  The patient is taking enteric-coated aspirin 81 mg daily.  No recent shortening of the patient's walking distance or new symptoms consistent with claudication.  No history of rest pain symptoms. No new ulcers or wounds of the lower extremities have occurred.  There is no history of DVT, PE or superficial thrombophlebitis. No recent episodes of angina or shortness of breath documented.   No outpatient medications have been marked as taking for the 08/30/23 encounter (Appointment) with Jama, Cordella MATSU, MD.    Past Medical History:  Diagnosis Date   Arthritis    Depression    Environmental and seasonal allergies    GERD (gastroesophageal reflux disease)    History of kidney stones    Hypertension    Kidney stones     Past Surgical History:  Procedure Laterality Date   BACK SURGERY     CYSTOSCOPY W/ URETERAL STENT PLACEMENT Bilateral 06/19/2016   Procedure: CYSTOSCOPY WITH STENT REPLACEMENT;  Surgeon: Chauncey Redell Agent,  MD;  Location: [facility];  Service: Urology;  Laterality: Bilateral;   CYSTOSCOPY WITH STENT PLACEMENT Bilateral 05/21/2016   Procedure: CYSTOSCOPY WITH bilateral retrorade pylogram, left ureteral stent placement, attempted inertion of right ureteral stent;  Surgeon: Chauncey Redell Agent, MD;  Location: [facility];  Service: Urology;  Laterality: Bilateral;   GREEN LIGHT LASER TURP (TRANSURETHRAL RESECTION OF PROSTATE  2013   HAMMER TOE SURGERY     IR URETERAL STENT RIGHT NEW ACCESS W/SEP NEPHROSTOMY CATH  05/29/2016   KNEE DEBRIDEMENT Right    LUMBAR LAMINECTOMY     SEPTOPLASTY     URETEROSCOPY WITH HOLMIUM LASER LITHOTRIPSY Bilateral 06/19/2016   Procedure: URETEROSCOPY WITH HOLMIUM LASER LITHOTRIPSY;  Surgeon: Chauncey Redell Agent, MD;  Location: [facility];  Service: Urology;  Laterality: Bilateral;    Social History Social History   Tobacco Use   Smoking status: Never    Passive exposure: Never   Smokeless tobacco: Former    Types: Chew    Quit date: 06/04/1993  Vaping Use   Vaping status: Never Used  Substance Use Topics   Alcohol use: Yes    Alcohol/week: 7.0 standard drinks of alcohol    Types: 7 Cans of beer per week   Drug use: No    Family History Family History  Problem Relation Age of Onset   Heart disease Father    Heart disease Mother    Prostate cancer Neg Hx  Bladder Cancer Neg Hx    Kidney cancer Neg Hx     No Known Allergies   REVIEW OF SYSTEMS (Negative unless checked)  Constitutional: [] Weight loss  [] Fever  [] Chills Cardiac: [] Chest pain   [] Chest pressure   [] Palpitations   [] Shortness of breath when laying flat   [] Shortness of breath with exertion. Vascular:  [x] Pain in legs with walking   [] Pain in legs at rest  [] History of DVT   [] Phlebitis   [] Swelling in legs   [] Varicose veins   [] Non-healing ulcers Pulmonary:   [] Uses home oxygen   [] Productive cough   [] Hemoptysis   [] Wheeze  [] COPD   [] Asthma Neurologic:  [] Dizziness   [] Seizures   [] History  of stroke   [] History of TIA  [] Aphasia   [] Vissual changes   [] Weakness or numbness in arm   [] Weakness or numbness in leg Musculoskeletal:   [] Joint swelling   [] Joint pain   [] Low back pain Hematologic:  [] Easy bruising  [] Easy bleeding   [] Hypercoagulable state   [] Anemic Gastrointestinal:  [] Diarrhea   [] Vomiting  [] Gastroesophageal reflux/heartburn   [] Difficulty swallowing. Genitourinary:  [] Chronic kidney disease   [] Difficult urination  [] Frequent urination   [] Blood in urine Skin:  [] Rashes   [] Ulcers  Psychological:  [] History of anxiety   []  History of major depression.  Physical Examination  There were no vitals filed for this visit. There is no height or weight on file to calculate BMI. Gen: WD/WN, NAD Head: /AT, No temporalis wasting.  Ear/Nose/Throat: Hearing grossly intact, nares w/o erythema or drainage Eyes: PER, EOMI, sclera nonicteric.  Neck: Supple, no masses.  No bruit or JVD.  Pulmonary:  Good air movement, no audible wheezing, no use of accessory muscles.  Cardiac: RRR, normal S1, S2, no Murmurs. Vascular:  carotid bruit noted Vessel Right Left  Radial Palpable Palpable  Carotid  Palpable  Palpable  Subclav  Palpable Palpable  Gastrointestinal: soft, non-distended. No guarding/no peritoneal signs.  Musculoskeletal: M/S 5/5 throughout.  No visible deformity.  Neurologic: CN 2-12 intact. Pain and light touch intact in extremities.  Symmetrical.  Speech is fluent. Motor exam as listed above. Psychiatric: Judgment intact, Mood & affect appropriate for pt's clinical situation. Dermatologic: No rashes or ulcers noted.  No changes consistent with cellulitis.   CBC Lab Results  Component Value Date   WBC 9.1 05/29/2016   HGB 13.8 05/29/2016   HCT 40.7 05/29/2016   MCV 90.7 05/29/2016   PLT 332 05/29/2016    BMET    Component Value Date/Time   NA 134 (L) 05/29/2016 0800   K 4.1 05/29/2016 0800   CL 103 05/29/2016 0800   CO2 24 05/29/2016 0800    GLUCOSE 104 (H) 05/29/2016 0800   BUN 26 (H) 05/29/2016 0800   CREATININE 1.20 08/27/2021 1415   CALCIUM 8.3 (L) 05/29/2016 0800   GFRNONAA 33 (L) 05/29/2016 0800   GFRAA 39 (L) 05/29/2016 0800   CrCl cannot be calculated (Patient's most recent lab result is older than the maximum 21 days allowed.).  COAG Lab Results  Component Value Date   INR 1.08 05/29/2016    Radiology CT ANGIO HEAD NECK W WO CM Result Date: 08/03/2023 EXAM: CT HEAD WITHOUT CTA HEAD AND NECK WITH AND WITHOUT 07/30/2023 04:13:21 PM TECHNIQUE: CTA of the head and neck was performed with and without the administration of intravenous contrast. Noncontrast CT of the head with reconstructed 2-D images are also provided for review. Multiplanar 2D and/or 3D reformatted images  are provided for review. Automated exposure control, iterative reconstruction, and/or weight based adjustment of the mA/kV was utilized to reduce the radiation dose to as low as reasonably achievable. COMPARISON: CT head without contrast 12/19/2003 and report of carotid doppler ultrasound 12/19/2003. More recent abnormal carotid doppler ultrasound. CLINICAL HISTORY: Pt c/o Lightheadedness x 4-5 months. He states he had a Carotid U/S done @ [facility] showing plaque in his Left Carotid Artery. NKI No hx CA, CVA, TIA, brain aneurysm, surg, or seizures. FINDINGS: CT HEAD: BRAIN AND VENTRICLES: No acute intracranial hemorrhage. No mass effect or midline shift. No extra-axial fluid collection. Gray-white differentiation is maintained. No hydrocephalus. ORBITS: No acute abnormality. SINUSES: No acute abnormality. SOFT TISSUES AND SKULL: No acute abnormality. CTA NECK: AORTIC ARCH AND ARCH VESSELS: Atherosclerotic calcifications are present in the distal aortic arch and descending thoracic aorta. No focal stenosis or dissection is present. Atherosclerotic calcifications are present at the origin of the right subclavian artery without significant stenosis. CERVICAL  CAROTID ARTERIES: Mild atherosclerotic changes are present at the right carotid bifurcation without significant stenosis relative to the more distal vessel. No significant atherosclerotic change or stenosis is present at the left carotid bifurcation. CERVICAL VERTEBRAL ARTERIES: The left vertebral artery is dominant. 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 VISUALIZED LUNGS AND MEDIASTINUM: Unremarkable. SOFT TISSUES: No acute abnormality. BONES: Multilevel degenerative changes are present in the cervical spine. Uncovertebral spurring leads to moderate right and mild left foraminal narrowing at C5-6. No focal osseous lesions are present. CTA HEAD: ANTERIOR CIRCULATION: Minimal atherosclerotic changes are present at the cavernous internal carotid arteries bilaterally. No significant stenosis is present through the ICA terminal. The anterior communicating artery is patent. No significant stenosis, aneurysm or large vessel occlusion is present within the anterior circulation POSTERIOR CIRCULATION: The distal right V3 and V4 segments are opacified. The left vertebral artery is dominant. The left vertebral artery is fed equally by a left P1 segment and left posterior communicating artery. The right PCA is of fetal type. The PCA branch vessels are normal bilaterally. OTHER: No dural venous sinus thrombosis on this non-dedicated study. IMPRESSION: 1. No acute intracranial abnormality. 2. 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 The vessel is reconstituted at or just below the neuro margin. This could represent retrograde flow. 3. Mild atherosclerotic changes at the right carotid bifurcation without significant stenosis. No significant atherosclerotic change or stenosis at the left carotid bifurcation. 4. Mild atherosclerotic changes within the cavernous internal carotid arteries without significant stenosis  relative to the more distal vessels. Electronically signed by: Lonni Necessary MD 08/03/2023 05:52 AM EDT RP Workstation: [id number]     Assessment/Plan There are no diagnoses linked to this encounter.   Cordella Shawl, MD  08/25/2023 3:43 PM

## 2023-08-30 ENCOUNTER — Encounter (INDEPENDENT_AMBULATORY_CARE_PROVIDER_SITE_OTHER): Payer: Self-pay | Admitting: Vascular Surgery

## 2023-08-30 ENCOUNTER — Ambulatory Visit (INDEPENDENT_AMBULATORY_CARE_PROVIDER_SITE_OTHER): Admitting: Vascular Surgery

## 2023-08-30 VITALS — BP 138/90 | HR 72 | Ht 70.0 in | Wt 212.6 lb

## 2023-08-30 DIAGNOSIS — I6501 Occlusion and stenosis of right vertebral artery: Secondary | ICD-10-CM

## 2023-08-30 DIAGNOSIS — I6523 Occlusion and stenosis of bilateral carotid arteries: Secondary | ICD-10-CM

## 2023-08-30 DIAGNOSIS — R55 Syncope and collapse: Secondary | ICD-10-CM | POA: Diagnosis not present

## 2023-08-30 DIAGNOSIS — I10 Essential (primary) hypertension: Secondary | ICD-10-CM | POA: Diagnosis not present

## 2023-08-30 DIAGNOSIS — E785 Hyperlipidemia, unspecified: Secondary | ICD-10-CM

## 2023-08-31 ENCOUNTER — Encounter (INDEPENDENT_AMBULATORY_CARE_PROVIDER_SITE_OTHER): Payer: Self-pay | Admitting: Vascular Surgery

## 2023-09-07 ENCOUNTER — Ambulatory Visit: Payer: Self-pay | Admitting: Urology

## 2023-09-07 VITALS — BP 167/80 | HR 70 | Wt 211.6 lb

## 2023-09-07 DIAGNOSIS — N401 Enlarged prostate with lower urinary tract symptoms: Secondary | ICD-10-CM

## 2023-09-07 DIAGNOSIS — N138 Other obstructive and reflux uropathy: Secondary | ICD-10-CM

## 2023-09-07 LAB — BLADDER SCAN AMB NON-IMAGING

## 2023-09-07 NOTE — Progress Notes (Signed)
   09/07/2023 10:12 AM   Mardy VEAR Tanda Mickey. 1945-12-27 [id number]  Reason for visit: Follow up gross hematuria, BPH  HPI: 78 year old male previously followed by Dr. Gala and Dr. Chauncey, he underwent a greenlight laser PVP with Dr. Gala in 2013, as well as bilateral ureteroscopy for stone disease in June 2018 with Dr. Chauncey.  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  He declined finasteride or repeat outlet procedure with HOLEP at that time and opted for observation alone.  He has done very well since then and denies any recurrent gross hematuria.  He denies any urinary symptoms, and PVR today is normal at 0ml.  He has minimally bothersome nocturia 1-3 times per night.  No urinary problems during the day.  We again reviewed treatment options if he has recurrent gross hematuria with either finasteride or HOLEP, and he would like to continue with observation alone.  Return precautions discussed at length.  Nocturia strategies discussed.  Follow-up with urology as needed, return precautions discussed  Redell JAYSON Burnet, MD  [facility] [address], [address] [place], [place] [address] [phone]

## 2023-09-07 NOTE — Patient Instructions (Signed)

## 2024-01-18 ENCOUNTER — Other Ambulatory Visit (HOSPITAL_COMMUNITY): Payer: Self-pay

## 2024-01-18 MED ORDER — SERTRALINE HCL 50 MG PO TABS
50.0000 mg | ORAL_TABLET | Freq: Every day | ORAL | 1 refills | Status: AC
  Filled 2024-01-18 – 2024-01-19 (×2): qty 100, 100d supply, fill #0

## 2024-01-18 MED ORDER — PRAVASTATIN SODIUM 20 MG PO TABS
20.0000 mg | ORAL_TABLET | Freq: Every day | ORAL | 1 refills | Status: AC
  Filled 2024-01-18 – 2024-01-19 (×2): qty 100, 100d supply, fill #0

## 2024-01-18 MED ORDER — FLUTICASONE PROPIONATE 50 MCG/ACT NA SUSP
2.0000 | Freq: Every day | NASAL | 1 refills | Status: AC | PRN
  Filled 2024-01-18 – 2024-01-19 (×3): qty 48, 90d supply, fill #0

## 2024-01-18 MED ORDER — OMEPRAZOLE 20 MG PO CPDR
20.0000 mg | DELAYED_RELEASE_CAPSULE | Freq: Every morning | ORAL | 1 refills | Status: AC
  Filled 2024-01-18 – 2024-01-19 (×2): qty 100, 100d supply, fill #0

## 2024-01-18 MED ORDER — TAMSULOSIN HCL 0.4 MG PO CAPS
0.4000 mg | ORAL_CAPSULE | Freq: Every day | ORAL | 1 refills | Status: AC
  Filled 2024-01-18 – 2024-01-19 (×2): qty 100, 100d supply, fill #0

## 2024-01-18 MED ORDER — LOSARTAN POTASSIUM 100 MG PO TABS
100.0000 mg | ORAL_TABLET | Freq: Every day | ORAL | 1 refills | Status: AC
  Filled 2024-01-18 – 2024-01-19 (×2): qty 100, 100d supply, fill #0

## 2024-01-19 ENCOUNTER — Other Ambulatory Visit: Payer: Self-pay

## 2024-01-19 ENCOUNTER — Other Ambulatory Visit (HOSPITAL_COMMUNITY): Payer: Self-pay

## 2024-08-28 ENCOUNTER — Ambulatory Visit (INDEPENDENT_AMBULATORY_CARE_PROVIDER_SITE_OTHER): Admitting: Vascular Surgery

## 2024-08-28 ENCOUNTER — Encounter (INDEPENDENT_AMBULATORY_CARE_PROVIDER_SITE_OTHER)
